# Patient Record
Sex: Female | Born: 1940
Health system: Southern US, Community
[De-identification: ages and names within clinical notes are randomized; demographics above are authoritative.]

## PROBLEM LIST (undated history)

## (undated) DIAGNOSIS — J449 Chronic obstructive pulmonary disease, unspecified: Secondary | ICD-10-CM

## (undated) DIAGNOSIS — F419 Anxiety disorder, unspecified: Secondary | ICD-10-CM

## (undated) DIAGNOSIS — M545 Low back pain, unspecified: Secondary | ICD-10-CM

## (undated) DIAGNOSIS — I509 Heart failure, unspecified: Secondary | ICD-10-CM

## (undated) DIAGNOSIS — R32 Unspecified urinary incontinence: Secondary | ICD-10-CM

## (undated) DIAGNOSIS — S73004A Unspecified dislocation of right hip, initial encounter: Secondary | ICD-10-CM

## (undated) DIAGNOSIS — E079 Disorder of thyroid, unspecified: Secondary | ICD-10-CM

## (undated) DIAGNOSIS — G8929 Other chronic pain: Secondary | ICD-10-CM

## (undated) DIAGNOSIS — Z8719 Personal history of other diseases of the digestive system: Secondary | ICD-10-CM

## (undated) DIAGNOSIS — Z9289 Personal history of other medical treatment: Secondary | ICD-10-CM

## (undated) DIAGNOSIS — E785 Hyperlipidemia, unspecified: Secondary | ICD-10-CM

## (undated) DIAGNOSIS — E119 Type 2 diabetes mellitus without complications: Secondary | ICD-10-CM

## (undated) DIAGNOSIS — E039 Hypothyroidism, unspecified: Secondary | ICD-10-CM

## (undated) DIAGNOSIS — I10 Essential (primary) hypertension: Secondary | ICD-10-CM

## (undated) HISTORY — PX: ABDOMINAL HYSTERECTOMY: SHX81

## (undated) HISTORY — PX: BACK SURGERY: SHX140

## (undated) HISTORY — PX: CHOLECYSTECTOMY: SHX55

## (undated) HISTORY — PX: DILATION AND CURETTAGE OF UTERUS: SHX78

## (undated) HISTORY — PX: ANKLE FRACTURE SURGERY: SHX122

---

## 1998-09-30 ENCOUNTER — Ambulatory Visit (HOSPITAL_COMMUNITY): Admission: RE | Admit: 1998-09-30 | Discharge: 1998-09-30 | Payer: Self-pay | Admitting: Family Medicine

## 1999-11-19 ENCOUNTER — Encounter: Admission: RE | Admit: 1999-11-19 | Discharge: 1999-11-19 | Payer: Self-pay | Admitting: Family Medicine

## 1999-11-19 ENCOUNTER — Encounter: Payer: Self-pay | Admitting: Family Medicine

## 1999-12-04 ENCOUNTER — Other Ambulatory Visit: Admission: RE | Admit: 1999-12-04 | Discharge: 1999-12-04 | Payer: Self-pay | Admitting: Family Medicine

## 2000-01-11 ENCOUNTER — Encounter: Payer: Self-pay | Admitting: Family Medicine

## 2000-01-11 ENCOUNTER — Encounter: Admission: RE | Admit: 2000-01-11 | Discharge: 2000-01-11 | Payer: Self-pay | Admitting: Family Medicine

## 2000-05-17 ENCOUNTER — Encounter: Admission: RE | Admit: 2000-05-17 | Discharge: 2000-05-17 | Payer: Self-pay | Admitting: Family Medicine

## 2000-05-17 ENCOUNTER — Encounter: Payer: Self-pay | Admitting: Family Medicine

## 2001-05-19 ENCOUNTER — Encounter: Payer: Self-pay | Admitting: Internal Medicine

## 2001-05-19 ENCOUNTER — Encounter: Admission: RE | Admit: 2001-05-19 | Discharge: 2001-05-19 | Payer: Self-pay | Admitting: Internal Medicine

## 2002-05-22 ENCOUNTER — Encounter: Payer: Self-pay | Admitting: Internal Medicine

## 2002-05-22 ENCOUNTER — Encounter: Admission: RE | Admit: 2002-05-22 | Discharge: 2002-05-22 | Payer: Self-pay | Admitting: Internal Medicine

## 2003-07-15 ENCOUNTER — Encounter: Payer: Self-pay | Admitting: Internal Medicine

## 2003-07-15 ENCOUNTER — Encounter: Admission: RE | Admit: 2003-07-15 | Discharge: 2003-07-15 | Payer: Self-pay | Admitting: Internal Medicine

## 2004-08-05 ENCOUNTER — Encounter: Admission: RE | Admit: 2004-08-05 | Discharge: 2004-08-05 | Payer: Self-pay | Admitting: Internal Medicine

## 2005-08-26 ENCOUNTER — Encounter: Admission: RE | Admit: 2005-08-26 | Discharge: 2005-08-26 | Payer: Self-pay | Admitting: Internal Medicine

## 2006-08-29 ENCOUNTER — Encounter: Admission: RE | Admit: 2006-08-29 | Discharge: 2006-08-29 | Payer: Self-pay | Admitting: Internal Medicine

## 2007-10-31 ENCOUNTER — Encounter: Admission: RE | Admit: 2007-10-31 | Discharge: 2007-10-31 | Payer: Self-pay | Admitting: Internal Medicine

## 2008-10-31 ENCOUNTER — Encounter: Admission: RE | Admit: 2008-10-31 | Discharge: 2008-10-31 | Payer: Self-pay | Admitting: Internal Medicine

## 2011-11-20 DIAGNOSIS — Z883 Allergy status to other anti-infective agents status: Secondary | ICD-10-CM | POA: Diagnosis not present

## 2011-11-20 DIAGNOSIS — K219 Gastro-esophageal reflux disease without esophagitis: Secondary | ICD-10-CM | POA: Diagnosis present

## 2011-11-20 DIAGNOSIS — E119 Type 2 diabetes mellitus without complications: Secondary | ICD-10-CM | POA: Diagnosis not present

## 2011-11-20 DIAGNOSIS — Z79899 Other long term (current) drug therapy: Secondary | ICD-10-CM | POA: Diagnosis not present

## 2011-11-20 DIAGNOSIS — E871 Hypo-osmolality and hyponatremia: Secondary | ICD-10-CM | POA: Diagnosis not present

## 2011-11-20 DIAGNOSIS — E039 Hypothyroidism, unspecified: Secondary | ICD-10-CM | POA: Diagnosis not present

## 2011-11-20 DIAGNOSIS — M129 Arthropathy, unspecified: Secondary | ICD-10-CM | POA: Diagnosis present

## 2011-11-20 DIAGNOSIS — Z886 Allergy status to analgesic agent status: Secondary | ICD-10-CM | POA: Diagnosis not present

## 2011-11-20 DIAGNOSIS — I4891 Unspecified atrial fibrillation: Secondary | ICD-10-CM | POA: Diagnosis not present

## 2011-11-20 DIAGNOSIS — G8929 Other chronic pain: Secondary | ICD-10-CM | POA: Diagnosis present

## 2011-11-20 DIAGNOSIS — Z888 Allergy status to other drugs, medicaments and biological substances status: Secondary | ICD-10-CM | POA: Diagnosis not present

## 2011-11-20 DIAGNOSIS — R079 Chest pain, unspecified: Secondary | ICD-10-CM | POA: Diagnosis not present

## 2011-11-20 DIAGNOSIS — R002 Palpitations: Secondary | ICD-10-CM | POA: Diagnosis not present

## 2011-11-20 DIAGNOSIS — R0789 Other chest pain: Secondary | ICD-10-CM | POA: Diagnosis not present

## 2011-11-20 DIAGNOSIS — R918 Other nonspecific abnormal finding of lung field: Secondary | ICD-10-CM | POA: Diagnosis not present

## 2011-11-20 DIAGNOSIS — Z7982 Long term (current) use of aspirin: Secondary | ICD-10-CM | POA: Diagnosis not present

## 2011-11-20 DIAGNOSIS — K449 Diaphragmatic hernia without obstruction or gangrene: Secondary | ICD-10-CM | POA: Diagnosis present

## 2011-11-20 DIAGNOSIS — I776 Arteritis, unspecified: Secondary | ICD-10-CM | POA: Diagnosis not present

## 2011-11-20 DIAGNOSIS — J189 Pneumonia, unspecified organism: Secondary | ICD-10-CM | POA: Diagnosis not present

## 2011-11-20 DIAGNOSIS — M545 Low back pain: Secondary | ICD-10-CM | POA: Diagnosis present

## 2011-11-20 DIAGNOSIS — R0602 Shortness of breath: Secondary | ICD-10-CM | POA: Diagnosis not present

## 2011-11-20 DIAGNOSIS — F17200 Nicotine dependence, unspecified, uncomplicated: Secondary | ICD-10-CM | POA: Diagnosis present

## 2011-11-20 DIAGNOSIS — E785 Hyperlipidemia, unspecified: Secondary | ICD-10-CM | POA: Diagnosis present

## 2011-11-20 DIAGNOSIS — E7800 Pure hypercholesterolemia, unspecified: Secondary | ICD-10-CM | POA: Diagnosis present

## 2011-11-22 DIAGNOSIS — I5030 Unspecified diastolic (congestive) heart failure: Secondary | ICD-10-CM

## 2011-11-24 DIAGNOSIS — R0602 Shortness of breath: Secondary | ICD-10-CM | POA: Diagnosis not present

## 2011-11-24 DIAGNOSIS — R918 Other nonspecific abnormal finding of lung field: Secondary | ICD-10-CM | POA: Diagnosis not present

## 2011-11-24 DIAGNOSIS — R079 Chest pain, unspecified: Secondary | ICD-10-CM | POA: Diagnosis not present

## 2011-11-24 DIAGNOSIS — J189 Pneumonia, unspecified organism: Secondary | ICD-10-CM | POA: Diagnosis not present

## 2011-11-25 DIAGNOSIS — E1165 Type 2 diabetes mellitus with hyperglycemia: Secondary | ICD-10-CM | POA: Diagnosis not present

## 2011-11-25 DIAGNOSIS — E039 Hypothyroidism, unspecified: Secondary | ICD-10-CM | POA: Diagnosis present

## 2011-11-25 DIAGNOSIS — E861 Hypovolemia: Secondary | ICD-10-CM | POA: Diagnosis present

## 2011-11-25 DIAGNOSIS — Z886 Allergy status to analgesic agent status: Secondary | ICD-10-CM | POA: Diagnosis not present

## 2011-11-25 DIAGNOSIS — J8410 Pulmonary fibrosis, unspecified: Secondary | ICD-10-CM | POA: Diagnosis not present

## 2011-11-25 DIAGNOSIS — E785 Hyperlipidemia, unspecified: Secondary | ICD-10-CM | POA: Diagnosis present

## 2011-11-25 DIAGNOSIS — E7800 Pure hypercholesterolemia, unspecified: Secondary | ICD-10-CM | POA: Diagnosis present

## 2011-11-25 DIAGNOSIS — Z780 Asymptomatic menopausal state: Secondary | ICD-10-CM | POA: Diagnosis not present

## 2011-11-25 DIAGNOSIS — J189 Pneumonia, unspecified organism: Secondary | ICD-10-CM | POA: Diagnosis not present

## 2011-11-25 DIAGNOSIS — K219 Gastro-esophageal reflux disease without esophagitis: Secondary | ICD-10-CM | POA: Diagnosis present

## 2011-11-25 DIAGNOSIS — Z883 Allergy status to other anti-infective agents status: Secondary | ICD-10-CM | POA: Diagnosis not present

## 2011-11-25 DIAGNOSIS — J449 Chronic obstructive pulmonary disease, unspecified: Secondary | ICD-10-CM | POA: Diagnosis present

## 2011-11-25 DIAGNOSIS — D509 Iron deficiency anemia, unspecified: Secondary | ICD-10-CM | POA: Diagnosis not present

## 2011-11-25 DIAGNOSIS — I517 Cardiomegaly: Secondary | ICD-10-CM | POA: Diagnosis not present

## 2011-11-25 DIAGNOSIS — M479 Spondylosis, unspecified: Secondary | ICD-10-CM | POA: Diagnosis present

## 2011-11-25 DIAGNOSIS — I776 Arteritis, unspecified: Secondary | ICD-10-CM | POA: Diagnosis not present

## 2011-11-25 DIAGNOSIS — R9389 Abnormal findings on diagnostic imaging of other specified body structures: Secondary | ICD-10-CM | POA: Diagnosis not present

## 2011-11-25 DIAGNOSIS — K449 Diaphragmatic hernia without obstruction or gangrene: Secondary | ICD-10-CM | POA: Diagnosis present

## 2011-11-25 DIAGNOSIS — I10 Essential (primary) hypertension: Secondary | ICD-10-CM | POA: Diagnosis not present

## 2011-11-25 DIAGNOSIS — Z79899 Other long term (current) drug therapy: Secondary | ICD-10-CM | POA: Diagnosis not present

## 2011-11-25 DIAGNOSIS — N189 Chronic kidney disease, unspecified: Secondary | ICD-10-CM | POA: Diagnosis not present

## 2011-11-25 DIAGNOSIS — F17200 Nicotine dependence, unspecified, uncomplicated: Secondary | ICD-10-CM | POA: Diagnosis present

## 2011-11-25 DIAGNOSIS — R918 Other nonspecific abnormal finding of lung field: Secondary | ICD-10-CM | POA: Diagnosis not present

## 2011-11-25 DIAGNOSIS — E119 Type 2 diabetes mellitus without complications: Secondary | ICD-10-CM | POA: Diagnosis present

## 2011-11-25 DIAGNOSIS — I4891 Unspecified atrial fibrillation: Secondary | ICD-10-CM | POA: Diagnosis not present

## 2011-11-25 DIAGNOSIS — G8929 Other chronic pain: Secondary | ICD-10-CM | POA: Diagnosis present

## 2011-11-25 DIAGNOSIS — E871 Hypo-osmolality and hyponatremia: Secondary | ICD-10-CM | POA: Diagnosis not present

## 2011-11-25 DIAGNOSIS — M545 Low back pain: Secondary | ICD-10-CM | POA: Diagnosis present

## 2011-12-21 DIAGNOSIS — J209 Acute bronchitis, unspecified: Secondary | ICD-10-CM | POA: Diagnosis not present

## 2011-12-21 DIAGNOSIS — K219 Gastro-esophageal reflux disease without esophagitis: Secondary | ICD-10-CM | POA: Diagnosis not present

## 2011-12-21 DIAGNOSIS — M545 Low back pain: Secondary | ICD-10-CM | POA: Diagnosis not present

## 2011-12-21 DIAGNOSIS — R1084 Generalized abdominal pain: Secondary | ICD-10-CM | POA: Diagnosis not present

## 2011-12-21 DIAGNOSIS — F329 Major depressive disorder, single episode, unspecified: Secondary | ICD-10-CM | POA: Diagnosis not present

## 2011-12-21 DIAGNOSIS — E782 Mixed hyperlipidemia: Secondary | ICD-10-CM | POA: Diagnosis not present

## 2012-01-24 DIAGNOSIS — R1084 Generalized abdominal pain: Secondary | ICD-10-CM | POA: Diagnosis not present

## 2012-01-24 DIAGNOSIS — J209 Acute bronchitis, unspecified: Secondary | ICD-10-CM | POA: Diagnosis not present

## 2012-01-24 DIAGNOSIS — M545 Low back pain: Secondary | ICD-10-CM | POA: Diagnosis not present

## 2012-01-24 DIAGNOSIS — K219 Gastro-esophageal reflux disease without esophagitis: Secondary | ICD-10-CM | POA: Diagnosis not present

## 2012-01-24 DIAGNOSIS — F329 Major depressive disorder, single episode, unspecified: Secondary | ICD-10-CM | POA: Diagnosis not present

## 2012-01-24 DIAGNOSIS — E782 Mixed hyperlipidemia: Secondary | ICD-10-CM | POA: Diagnosis not present

## 2012-02-14 DIAGNOSIS — M545 Low back pain: Secondary | ICD-10-CM | POA: Diagnosis not present

## 2012-02-14 DIAGNOSIS — J209 Acute bronchitis, unspecified: Secondary | ICD-10-CM | POA: Diagnosis not present

## 2012-02-14 DIAGNOSIS — F329 Major depressive disorder, single episode, unspecified: Secondary | ICD-10-CM | POA: Diagnosis not present

## 2012-02-14 DIAGNOSIS — E782 Mixed hyperlipidemia: Secondary | ICD-10-CM | POA: Diagnosis not present

## 2012-02-14 DIAGNOSIS — R1084 Generalized abdominal pain: Secondary | ICD-10-CM | POA: Diagnosis not present

## 2012-02-14 DIAGNOSIS — K219 Gastro-esophageal reflux disease without esophagitis: Secondary | ICD-10-CM | POA: Diagnosis not present

## 2012-02-23 DIAGNOSIS — R5381 Other malaise: Secondary | ICD-10-CM | POA: Diagnosis not present

## 2012-02-23 DIAGNOSIS — E871 Hypo-osmolality and hyponatremia: Secondary | ICD-10-CM | POA: Diagnosis not present

## 2012-02-23 DIAGNOSIS — K5900 Constipation, unspecified: Secondary | ICD-10-CM | POA: Diagnosis not present

## 2012-02-23 DIAGNOSIS — H6120 Impacted cerumen, unspecified ear: Secondary | ICD-10-CM | POA: Diagnosis not present

## 2012-02-23 DIAGNOSIS — F411 Generalized anxiety disorder: Secondary | ICD-10-CM | POA: Diagnosis not present

## 2012-02-23 DIAGNOSIS — R109 Unspecified abdominal pain: Secondary | ICD-10-CM | POA: Diagnosis not present

## 2012-02-23 DIAGNOSIS — I509 Heart failure, unspecified: Secondary | ICD-10-CM | POA: Diagnosis not present

## 2012-02-23 DIAGNOSIS — I10 Essential (primary) hypertension: Secondary | ICD-10-CM | POA: Diagnosis not present

## 2012-02-23 DIAGNOSIS — D649 Anemia, unspecified: Secondary | ICD-10-CM | POA: Diagnosis not present

## 2012-02-24 DIAGNOSIS — I10 Essential (primary) hypertension: Secondary | ICD-10-CM | POA: Diagnosis not present

## 2012-02-24 DIAGNOSIS — E871 Hypo-osmolality and hyponatremia: Secondary | ICD-10-CM | POA: Diagnosis not present

## 2012-02-24 DIAGNOSIS — F411 Generalized anxiety disorder: Secondary | ICD-10-CM | POA: Diagnosis not present

## 2012-02-24 DIAGNOSIS — R5381 Other malaise: Secondary | ICD-10-CM | POA: Diagnosis not present

## 2012-02-24 DIAGNOSIS — R109 Unspecified abdominal pain: Secondary | ICD-10-CM | POA: Diagnosis not present

## 2012-02-24 DIAGNOSIS — K5900 Constipation, unspecified: Secondary | ICD-10-CM | POA: Diagnosis not present

## 2012-02-24 DIAGNOSIS — D649 Anemia, unspecified: Secondary | ICD-10-CM | POA: Diagnosis not present

## 2012-02-24 DIAGNOSIS — I509 Heart failure, unspecified: Secondary | ICD-10-CM | POA: Diagnosis not present

## 2012-02-25 DIAGNOSIS — R3915 Urgency of urination: Secondary | ICD-10-CM | POA: Diagnosis present

## 2012-02-25 DIAGNOSIS — D649 Anemia, unspecified: Secondary | ICD-10-CM | POA: Diagnosis not present

## 2012-02-25 DIAGNOSIS — I509 Heart failure, unspecified: Secondary | ICD-10-CM | POA: Diagnosis not present

## 2012-02-25 DIAGNOSIS — J449 Chronic obstructive pulmonary disease, unspecified: Secondary | ICD-10-CM | POA: Diagnosis present

## 2012-02-25 DIAGNOSIS — R195 Other fecal abnormalities: Secondary | ICD-10-CM | POA: Diagnosis present

## 2012-02-25 DIAGNOSIS — E876 Hypokalemia: Secondary | ICD-10-CM | POA: Diagnosis present

## 2012-02-25 DIAGNOSIS — K5900 Constipation, unspecified: Secondary | ICD-10-CM | POA: Diagnosis not present

## 2012-02-25 DIAGNOSIS — E119 Type 2 diabetes mellitus without complications: Secondary | ICD-10-CM | POA: Diagnosis not present

## 2012-02-25 DIAGNOSIS — Z7982 Long term (current) use of aspirin: Secondary | ICD-10-CM | POA: Diagnosis not present

## 2012-02-25 DIAGNOSIS — E039 Hypothyroidism, unspecified: Secondary | ICD-10-CM | POA: Diagnosis present

## 2012-02-25 DIAGNOSIS — N289 Disorder of kidney and ureter, unspecified: Secondary | ICD-10-CM | POA: Diagnosis present

## 2012-02-25 DIAGNOSIS — F17200 Nicotine dependence, unspecified, uncomplicated: Secondary | ICD-10-CM | POA: Diagnosis present

## 2012-02-25 DIAGNOSIS — Z8249 Family history of ischemic heart disease and other diseases of the circulatory system: Secondary | ICD-10-CM | POA: Diagnosis not present

## 2012-02-25 DIAGNOSIS — E871 Hypo-osmolality and hyponatremia: Secondary | ICD-10-CM | POA: Diagnosis present

## 2012-02-25 DIAGNOSIS — Z79899 Other long term (current) drug therapy: Secondary | ICD-10-CM | POA: Diagnosis not present

## 2012-02-25 DIAGNOSIS — E1129 Type 2 diabetes mellitus with other diabetic kidney complication: Secondary | ICD-10-CM | POA: Diagnosis not present

## 2012-02-25 DIAGNOSIS — I10 Essential (primary) hypertension: Secondary | ICD-10-CM | POA: Diagnosis not present

## 2012-02-25 DIAGNOSIS — L089 Local infection of the skin and subcutaneous tissue, unspecified: Secondary | ICD-10-CM | POA: Diagnosis not present

## 2012-02-25 DIAGNOSIS — F411 Generalized anxiety disorder: Secondary | ICD-10-CM | POA: Diagnosis not present

## 2012-02-25 DIAGNOSIS — Z780 Asymptomatic menopausal state: Secondary | ICD-10-CM | POA: Diagnosis not present

## 2012-02-25 DIAGNOSIS — I776 Arteritis, unspecified: Secondary | ICD-10-CM | POA: Diagnosis not present

## 2012-02-25 DIAGNOSIS — N179 Acute kidney failure, unspecified: Secondary | ICD-10-CM | POA: Diagnosis not present

## 2012-02-25 DIAGNOSIS — M545 Low back pain: Secondary | ICD-10-CM | POA: Diagnosis present

## 2012-02-25 DIAGNOSIS — Z823 Family history of stroke: Secondary | ICD-10-CM | POA: Diagnosis not present

## 2012-02-25 DIAGNOSIS — D638 Anemia in other chronic diseases classified elsewhere: Secondary | ICD-10-CM | POA: Diagnosis not present

## 2012-02-25 DIAGNOSIS — R5383 Other fatigue: Secondary | ICD-10-CM | POA: Diagnosis not present

## 2012-02-25 DIAGNOSIS — G8929 Other chronic pain: Secondary | ICD-10-CM | POA: Diagnosis present

## 2012-02-25 DIAGNOSIS — I4891 Unspecified atrial fibrillation: Secondary | ICD-10-CM | POA: Diagnosis present

## 2012-02-25 DIAGNOSIS — Z885 Allergy status to narcotic agent status: Secondary | ICD-10-CM | POA: Diagnosis not present

## 2012-02-25 DIAGNOSIS — R350 Frequency of micturition: Secondary | ICD-10-CM | POA: Diagnosis present

## 2012-02-25 DIAGNOSIS — E785 Hyperlipidemia, unspecified: Secondary | ICD-10-CM | POA: Diagnosis present

## 2012-02-25 DIAGNOSIS — Z800 Family history of malignant neoplasm of digestive organs: Secondary | ICD-10-CM | POA: Diagnosis not present

## 2012-03-14 DIAGNOSIS — K529 Noninfective gastroenteritis and colitis, unspecified: Secondary | ICD-10-CM | POA: Diagnosis not present

## 2012-03-16 DIAGNOSIS — L989 Disorder of the skin and subcutaneous tissue, unspecified: Secondary | ICD-10-CM | POA: Diagnosis not present

## 2012-03-21 DIAGNOSIS — K2900 Acute gastritis without bleeding: Secondary | ICD-10-CM | POA: Diagnosis not present

## 2012-04-13 DIAGNOSIS — R195 Other fecal abnormalities: Secondary | ICD-10-CM | POA: Diagnosis not present

## 2012-04-13 DIAGNOSIS — D649 Anemia, unspecified: Secondary | ICD-10-CM | POA: Diagnosis not present

## 2012-04-25 DIAGNOSIS — R51 Headache: Secondary | ICD-10-CM | POA: Diagnosis not present

## 2012-04-25 DIAGNOSIS — R5383 Other fatigue: Secondary | ICD-10-CM | POA: Diagnosis not present

## 2012-04-25 DIAGNOSIS — D649 Anemia, unspecified: Secondary | ICD-10-CM | POA: Diagnosis not present

## 2012-05-02 DIAGNOSIS — K589 Irritable bowel syndrome without diarrhea: Secondary | ICD-10-CM | POA: Diagnosis not present

## 2012-05-02 DIAGNOSIS — E119 Type 2 diabetes mellitus without complications: Secondary | ICD-10-CM | POA: Diagnosis not present

## 2012-05-02 DIAGNOSIS — K219 Gastro-esophageal reflux disease without esophagitis: Secondary | ICD-10-CM | POA: Diagnosis not present

## 2012-05-02 DIAGNOSIS — Z7982 Long term (current) use of aspirin: Secondary | ICD-10-CM | POA: Diagnosis not present

## 2012-05-02 DIAGNOSIS — Z87891 Personal history of nicotine dependence: Secondary | ICD-10-CM | POA: Diagnosis not present

## 2012-05-02 DIAGNOSIS — R112 Nausea with vomiting, unspecified: Secondary | ICD-10-CM | POA: Diagnosis not present

## 2012-05-02 DIAGNOSIS — Z79899 Other long term (current) drug therapy: Secondary | ICD-10-CM | POA: Diagnosis not present

## 2012-05-02 DIAGNOSIS — I10 Essential (primary) hypertension: Secondary | ICD-10-CM | POA: Diagnosis not present

## 2012-05-15 DIAGNOSIS — IMO0002 Reserved for concepts with insufficient information to code with codable children: Secondary | ICD-10-CM | POA: Diagnosis not present

## 2012-05-17 DIAGNOSIS — F411 Generalized anxiety disorder: Secondary | ICD-10-CM | POA: Diagnosis not present

## 2012-05-17 DIAGNOSIS — E119 Type 2 diabetes mellitus without complications: Secondary | ICD-10-CM | POA: Diagnosis not present

## 2012-05-17 DIAGNOSIS — Z87891 Personal history of nicotine dependence: Secondary | ICD-10-CM | POA: Diagnosis not present

## 2012-05-17 DIAGNOSIS — I491 Atrial premature depolarization: Secondary | ICD-10-CM | POA: Diagnosis not present

## 2012-05-17 DIAGNOSIS — Z7982 Long term (current) use of aspirin: Secondary | ICD-10-CM | POA: Diagnosis not present

## 2012-05-17 DIAGNOSIS — R51 Headache: Secondary | ICD-10-CM | POA: Diagnosis not present

## 2012-05-17 DIAGNOSIS — I10 Essential (primary) hypertension: Secondary | ICD-10-CM | POA: Diagnosis not present

## 2012-05-17 DIAGNOSIS — G4700 Insomnia, unspecified: Secondary | ICD-10-CM | POA: Diagnosis not present

## 2012-05-17 DIAGNOSIS — Z79899 Other long term (current) drug therapy: Secondary | ICD-10-CM | POA: Diagnosis not present

## 2012-05-17 DIAGNOSIS — R42 Dizziness and giddiness: Secondary | ICD-10-CM | POA: Diagnosis not present

## 2012-06-30 DIAGNOSIS — M549 Dorsalgia, unspecified: Secondary | ICD-10-CM | POA: Diagnosis not present

## 2012-07-09 DIAGNOSIS — K5730 Diverticulosis of large intestine without perforation or abscess without bleeding: Secondary | ICD-10-CM | POA: Diagnosis not present

## 2012-07-09 DIAGNOSIS — R300 Dysuria: Secondary | ICD-10-CM | POA: Diagnosis not present

## 2012-07-09 DIAGNOSIS — M25559 Pain in unspecified hip: Secondary | ICD-10-CM | POA: Diagnosis not present

## 2012-07-09 DIAGNOSIS — R112 Nausea with vomiting, unspecified: Secondary | ICD-10-CM | POA: Diagnosis not present

## 2012-07-09 DIAGNOSIS — Z79899 Other long term (current) drug therapy: Secondary | ICD-10-CM | POA: Diagnosis not present

## 2012-07-09 DIAGNOSIS — R030 Elevated blood-pressure reading, without diagnosis of hypertension: Secondary | ICD-10-CM

## 2012-07-09 DIAGNOSIS — Z87891 Personal history of nicotine dependence: Secondary | ICD-10-CM | POA: Diagnosis not present

## 2012-07-09 DIAGNOSIS — I10 Essential (primary) hypertension: Secondary | ICD-10-CM | POA: Diagnosis not present

## 2012-07-09 DIAGNOSIS — E119 Type 2 diabetes mellitus without complications: Secondary | ICD-10-CM | POA: Diagnosis not present

## 2012-07-09 DIAGNOSIS — R197 Diarrhea, unspecified: Secondary | ICD-10-CM | POA: Diagnosis not present

## 2012-07-09 DIAGNOSIS — Z7982 Long term (current) use of aspirin: Secondary | ICD-10-CM | POA: Diagnosis not present

## 2012-07-13 DIAGNOSIS — Z981 Arthrodesis status: Secondary | ICD-10-CM | POA: Diagnosis not present

## 2012-07-13 DIAGNOSIS — M87059 Idiopathic aseptic necrosis of unspecified femur: Secondary | ICD-10-CM | POA: Diagnosis not present

## 2012-07-13 DIAGNOSIS — M169 Osteoarthritis of hip, unspecified: Secondary | ICD-10-CM | POA: Diagnosis not present

## 2012-07-26 DIAGNOSIS — E860 Dehydration: Secondary | ICD-10-CM | POA: Diagnosis not present

## 2012-07-26 DIAGNOSIS — K922 Gastrointestinal hemorrhage, unspecified: Secondary | ICD-10-CM | POA: Diagnosis not present

## 2012-07-26 DIAGNOSIS — D62 Acute posthemorrhagic anemia: Secondary | ICD-10-CM | POA: Diagnosis not present

## 2012-07-26 DIAGNOSIS — K5289 Other specified noninfective gastroenteritis and colitis: Secondary | ICD-10-CM | POA: Diagnosis not present

## 2012-07-26 DIAGNOSIS — R52 Pain, unspecified: Secondary | ICD-10-CM | POA: Diagnosis not present

## 2012-07-26 DIAGNOSIS — R112 Nausea with vomiting, unspecified: Secondary | ICD-10-CM | POA: Diagnosis not present

## 2012-07-27 DIAGNOSIS — K922 Gastrointestinal hemorrhage, unspecified: Secondary | ICD-10-CM | POA: Diagnosis not present

## 2012-07-27 DIAGNOSIS — F17200 Nicotine dependence, unspecified, uncomplicated: Secondary | ICD-10-CM | POA: Diagnosis present

## 2012-07-27 DIAGNOSIS — I10 Essential (primary) hypertension: Secondary | ICD-10-CM | POA: Diagnosis present

## 2012-07-27 DIAGNOSIS — R112 Nausea with vomiting, unspecified: Secondary | ICD-10-CM | POA: Diagnosis not present

## 2012-07-27 DIAGNOSIS — Z780 Asymptomatic menopausal state: Secondary | ICD-10-CM | POA: Diagnosis not present

## 2012-07-27 DIAGNOSIS — Z883 Allergy status to other anti-infective agents status: Secondary | ICD-10-CM | POA: Diagnosis not present

## 2012-07-27 DIAGNOSIS — R32 Unspecified urinary incontinence: Secondary | ICD-10-CM | POA: Diagnosis not present

## 2012-07-27 DIAGNOSIS — Z79899 Other long term (current) drug therapy: Secondary | ICD-10-CM | POA: Diagnosis not present

## 2012-07-27 DIAGNOSIS — Z888 Allergy status to other drugs, medicaments and biological substances status: Secondary | ICD-10-CM | POA: Diagnosis not present

## 2012-07-27 DIAGNOSIS — K5289 Other specified noninfective gastroenteritis and colitis: Secondary | ICD-10-CM | POA: Diagnosis present

## 2012-07-27 DIAGNOSIS — Z886 Allergy status to analgesic agent status: Secondary | ICD-10-CM | POA: Diagnosis not present

## 2012-07-27 DIAGNOSIS — D62 Acute posthemorrhagic anemia: Secondary | ICD-10-CM | POA: Diagnosis present

## 2012-07-27 DIAGNOSIS — E785 Hyperlipidemia, unspecified: Secondary | ICD-10-CM | POA: Diagnosis present

## 2012-07-27 DIAGNOSIS — N289 Disorder of kidney and ureter, unspecified: Secondary | ICD-10-CM | POA: Diagnosis present

## 2012-07-27 DIAGNOSIS — Z7982 Long term (current) use of aspirin: Secondary | ICD-10-CM | POA: Diagnosis not present

## 2012-07-27 DIAGNOSIS — E119 Type 2 diabetes mellitus without complications: Secondary | ICD-10-CM | POA: Diagnosis present

## 2012-07-27 DIAGNOSIS — E039 Hypothyroidism, unspecified: Secondary | ICD-10-CM | POA: Diagnosis present

## 2012-07-27 DIAGNOSIS — E860 Dehydration: Secondary | ICD-10-CM | POA: Diagnosis present

## 2012-07-27 DIAGNOSIS — K449 Diaphragmatic hernia without obstruction or gangrene: Secondary | ICD-10-CM | POA: Diagnosis present

## 2012-08-02 DIAGNOSIS — E119 Type 2 diabetes mellitus without complications: Secondary | ICD-10-CM | POA: Diagnosis not present

## 2012-08-02 DIAGNOSIS — K922 Gastrointestinal hemorrhage, unspecified: Secondary | ICD-10-CM | POA: Diagnosis not present

## 2012-08-02 DIAGNOSIS — K5289 Other specified noninfective gastroenteritis and colitis: Secondary | ICD-10-CM | POA: Diagnosis not present

## 2012-08-02 DIAGNOSIS — M1610 Unilateral primary osteoarthritis, unspecified hip: Secondary | ICD-10-CM | POA: Diagnosis not present

## 2012-08-02 DIAGNOSIS — J449 Chronic obstructive pulmonary disease, unspecified: Secondary | ICD-10-CM | POA: Diagnosis not present

## 2012-08-02 DIAGNOSIS — I10 Essential (primary) hypertension: Secondary | ICD-10-CM | POA: Diagnosis not present

## 2012-08-03 DIAGNOSIS — J449 Chronic obstructive pulmonary disease, unspecified: Secondary | ICD-10-CM | POA: Diagnosis not present

## 2012-08-03 DIAGNOSIS — I10 Essential (primary) hypertension: Secondary | ICD-10-CM | POA: Diagnosis not present

## 2012-08-03 DIAGNOSIS — M1610 Unilateral primary osteoarthritis, unspecified hip: Secondary | ICD-10-CM | POA: Diagnosis not present

## 2012-08-03 DIAGNOSIS — E119 Type 2 diabetes mellitus without complications: Secondary | ICD-10-CM | POA: Diagnosis not present

## 2012-08-03 DIAGNOSIS — K922 Gastrointestinal hemorrhage, unspecified: Secondary | ICD-10-CM | POA: Diagnosis not present

## 2012-08-03 DIAGNOSIS — K5289 Other specified noninfective gastroenteritis and colitis: Secondary | ICD-10-CM | POA: Diagnosis not present

## 2012-08-04 DIAGNOSIS — K922 Gastrointestinal hemorrhage, unspecified: Secondary | ICD-10-CM | POA: Diagnosis not present

## 2012-08-04 DIAGNOSIS — E119 Type 2 diabetes mellitus without complications: Secondary | ICD-10-CM | POA: Diagnosis not present

## 2012-08-04 DIAGNOSIS — I10 Essential (primary) hypertension: Secondary | ICD-10-CM | POA: Diagnosis not present

## 2012-08-04 DIAGNOSIS — M1610 Unilateral primary osteoarthritis, unspecified hip: Secondary | ICD-10-CM | POA: Diagnosis not present

## 2012-08-04 DIAGNOSIS — J449 Chronic obstructive pulmonary disease, unspecified: Secondary | ICD-10-CM | POA: Diagnosis not present

## 2012-08-04 DIAGNOSIS — K5289 Other specified noninfective gastroenteritis and colitis: Secondary | ICD-10-CM | POA: Diagnosis not present

## 2012-08-05 DIAGNOSIS — K922 Gastrointestinal hemorrhage, unspecified: Secondary | ICD-10-CM | POA: Diagnosis not present

## 2012-08-05 DIAGNOSIS — I10 Essential (primary) hypertension: Secondary | ICD-10-CM | POA: Diagnosis not present

## 2012-08-05 DIAGNOSIS — K5289 Other specified noninfective gastroenteritis and colitis: Secondary | ICD-10-CM | POA: Diagnosis not present

## 2012-08-05 DIAGNOSIS — E119 Type 2 diabetes mellitus without complications: Secondary | ICD-10-CM | POA: Diagnosis not present

## 2012-08-05 DIAGNOSIS — M1610 Unilateral primary osteoarthritis, unspecified hip: Secondary | ICD-10-CM | POA: Diagnosis not present

## 2012-08-05 DIAGNOSIS — J449 Chronic obstructive pulmonary disease, unspecified: Secondary | ICD-10-CM | POA: Diagnosis not present

## 2012-08-07 DIAGNOSIS — K5289 Other specified noninfective gastroenteritis and colitis: Secondary | ICD-10-CM | POA: Diagnosis not present

## 2012-08-07 DIAGNOSIS — D649 Anemia, unspecified: Secondary | ICD-10-CM | POA: Diagnosis not present

## 2012-08-07 DIAGNOSIS — I10 Essential (primary) hypertension: Secondary | ICD-10-CM | POA: Diagnosis not present

## 2012-08-07 DIAGNOSIS — J449 Chronic obstructive pulmonary disease, unspecified: Secondary | ICD-10-CM | POA: Diagnosis not present

## 2012-08-07 DIAGNOSIS — K921 Melena: Secondary | ICD-10-CM | POA: Diagnosis not present

## 2012-08-07 DIAGNOSIS — K922 Gastrointestinal hemorrhage, unspecified: Secondary | ICD-10-CM | POA: Diagnosis not present

## 2012-08-07 DIAGNOSIS — E119 Type 2 diabetes mellitus without complications: Secondary | ICD-10-CM | POA: Diagnosis not present

## 2012-08-07 DIAGNOSIS — M1610 Unilateral primary osteoarthritis, unspecified hip: Secondary | ICD-10-CM | POA: Diagnosis not present

## 2012-08-08 DIAGNOSIS — I10 Essential (primary) hypertension: Secondary | ICD-10-CM | POA: Diagnosis not present

## 2012-08-08 DIAGNOSIS — E119 Type 2 diabetes mellitus without complications: Secondary | ICD-10-CM | POA: Diagnosis not present

## 2012-08-08 DIAGNOSIS — K5289 Other specified noninfective gastroenteritis and colitis: Secondary | ICD-10-CM | POA: Diagnosis not present

## 2012-08-08 DIAGNOSIS — K922 Gastrointestinal hemorrhage, unspecified: Secondary | ICD-10-CM | POA: Diagnosis not present

## 2012-08-08 DIAGNOSIS — M1610 Unilateral primary osteoarthritis, unspecified hip: Secondary | ICD-10-CM | POA: Diagnosis not present

## 2012-08-08 DIAGNOSIS — J449 Chronic obstructive pulmonary disease, unspecified: Secondary | ICD-10-CM | POA: Diagnosis not present

## 2012-08-09 DIAGNOSIS — E119 Type 2 diabetes mellitus without complications: Secondary | ICD-10-CM | POA: Diagnosis not present

## 2012-08-09 DIAGNOSIS — K922 Gastrointestinal hemorrhage, unspecified: Secondary | ICD-10-CM | POA: Diagnosis not present

## 2012-08-09 DIAGNOSIS — I10 Essential (primary) hypertension: Secondary | ICD-10-CM | POA: Diagnosis not present

## 2012-08-09 DIAGNOSIS — M1610 Unilateral primary osteoarthritis, unspecified hip: Secondary | ICD-10-CM | POA: Diagnosis not present

## 2012-08-09 DIAGNOSIS — J449 Chronic obstructive pulmonary disease, unspecified: Secondary | ICD-10-CM | POA: Diagnosis not present

## 2012-08-09 DIAGNOSIS — K5289 Other specified noninfective gastroenteritis and colitis: Secondary | ICD-10-CM | POA: Diagnosis not present

## 2012-08-11 DIAGNOSIS — J449 Chronic obstructive pulmonary disease, unspecified: Secondary | ICD-10-CM | POA: Diagnosis not present

## 2012-08-11 DIAGNOSIS — K922 Gastrointestinal hemorrhage, unspecified: Secondary | ICD-10-CM | POA: Diagnosis not present

## 2012-08-11 DIAGNOSIS — M1610 Unilateral primary osteoarthritis, unspecified hip: Secondary | ICD-10-CM | POA: Diagnosis not present

## 2012-08-11 DIAGNOSIS — E119 Type 2 diabetes mellitus without complications: Secondary | ICD-10-CM | POA: Diagnosis not present

## 2012-08-11 DIAGNOSIS — K5289 Other specified noninfective gastroenteritis and colitis: Secondary | ICD-10-CM | POA: Diagnosis not present

## 2012-08-11 DIAGNOSIS — I10 Essential (primary) hypertension: Secondary | ICD-10-CM | POA: Diagnosis not present

## 2012-08-14 DIAGNOSIS — E119 Type 2 diabetes mellitus without complications: Secondary | ICD-10-CM | POA: Diagnosis not present

## 2012-08-14 DIAGNOSIS — M1610 Unilateral primary osteoarthritis, unspecified hip: Secondary | ICD-10-CM | POA: Diagnosis not present

## 2012-08-14 DIAGNOSIS — K5289 Other specified noninfective gastroenteritis and colitis: Secondary | ICD-10-CM | POA: Diagnosis not present

## 2012-08-14 DIAGNOSIS — K922 Gastrointestinal hemorrhage, unspecified: Secondary | ICD-10-CM | POA: Diagnosis not present

## 2012-08-14 DIAGNOSIS — J449 Chronic obstructive pulmonary disease, unspecified: Secondary | ICD-10-CM | POA: Diagnosis not present

## 2012-08-14 DIAGNOSIS — I10 Essential (primary) hypertension: Secondary | ICD-10-CM | POA: Diagnosis not present

## 2012-08-15 DIAGNOSIS — J449 Chronic obstructive pulmonary disease, unspecified: Secondary | ICD-10-CM | POA: Diagnosis not present

## 2012-08-15 DIAGNOSIS — K5289 Other specified noninfective gastroenteritis and colitis: Secondary | ICD-10-CM | POA: Diagnosis not present

## 2012-08-15 DIAGNOSIS — I10 Essential (primary) hypertension: Secondary | ICD-10-CM | POA: Diagnosis not present

## 2012-08-15 DIAGNOSIS — E119 Type 2 diabetes mellitus without complications: Secondary | ICD-10-CM | POA: Diagnosis not present

## 2012-08-15 DIAGNOSIS — K922 Gastrointestinal hemorrhage, unspecified: Secondary | ICD-10-CM | POA: Diagnosis not present

## 2012-08-15 DIAGNOSIS — M1610 Unilateral primary osteoarthritis, unspecified hip: Secondary | ICD-10-CM | POA: Diagnosis not present

## 2012-08-16 DIAGNOSIS — E119 Type 2 diabetes mellitus without complications: Secondary | ICD-10-CM | POA: Diagnosis not present

## 2012-08-16 DIAGNOSIS — K5289 Other specified noninfective gastroenteritis and colitis: Secondary | ICD-10-CM | POA: Diagnosis not present

## 2012-08-16 DIAGNOSIS — J449 Chronic obstructive pulmonary disease, unspecified: Secondary | ICD-10-CM | POA: Diagnosis not present

## 2012-08-16 DIAGNOSIS — K922 Gastrointestinal hemorrhage, unspecified: Secondary | ICD-10-CM | POA: Diagnosis not present

## 2012-08-16 DIAGNOSIS — M1610 Unilateral primary osteoarthritis, unspecified hip: Secondary | ICD-10-CM | POA: Diagnosis not present

## 2012-08-16 DIAGNOSIS — I10 Essential (primary) hypertension: Secondary | ICD-10-CM | POA: Diagnosis not present

## 2012-08-17 DIAGNOSIS — K922 Gastrointestinal hemorrhage, unspecified: Secondary | ICD-10-CM | POA: Diagnosis not present

## 2012-08-17 DIAGNOSIS — R195 Other fecal abnormalities: Secondary | ICD-10-CM | POA: Diagnosis not present

## 2012-08-17 DIAGNOSIS — D649 Anemia, unspecified: Secondary | ICD-10-CM | POA: Diagnosis not present

## 2012-08-18 DIAGNOSIS — M1610 Unilateral primary osteoarthritis, unspecified hip: Secondary | ICD-10-CM | POA: Diagnosis not present

## 2012-08-18 DIAGNOSIS — K922 Gastrointestinal hemorrhage, unspecified: Secondary | ICD-10-CM | POA: Diagnosis not present

## 2012-08-18 DIAGNOSIS — J449 Chronic obstructive pulmonary disease, unspecified: Secondary | ICD-10-CM | POA: Diagnosis not present

## 2012-08-18 DIAGNOSIS — K5289 Other specified noninfective gastroenteritis and colitis: Secondary | ICD-10-CM | POA: Diagnosis not present

## 2012-08-18 DIAGNOSIS — E119 Type 2 diabetes mellitus without complications: Secondary | ICD-10-CM | POA: Diagnosis not present

## 2012-08-18 DIAGNOSIS — I10 Essential (primary) hypertension: Secondary | ICD-10-CM | POA: Diagnosis not present

## 2012-08-22 DIAGNOSIS — J449 Chronic obstructive pulmonary disease, unspecified: Secondary | ICD-10-CM | POA: Diagnosis not present

## 2012-08-22 DIAGNOSIS — K922 Gastrointestinal hemorrhage, unspecified: Secondary | ICD-10-CM | POA: Diagnosis not present

## 2012-08-22 DIAGNOSIS — K5289 Other specified noninfective gastroenteritis and colitis: Secondary | ICD-10-CM | POA: Diagnosis not present

## 2012-08-22 DIAGNOSIS — M1610 Unilateral primary osteoarthritis, unspecified hip: Secondary | ICD-10-CM | POA: Diagnosis not present

## 2012-08-22 DIAGNOSIS — E119 Type 2 diabetes mellitus without complications: Secondary | ICD-10-CM | POA: Diagnosis not present

## 2012-08-22 DIAGNOSIS — I10 Essential (primary) hypertension: Secondary | ICD-10-CM | POA: Diagnosis not present

## 2012-08-23 DIAGNOSIS — G589 Mononeuropathy, unspecified: Secondary | ICD-10-CM | POA: Diagnosis not present

## 2012-08-23 DIAGNOSIS — R195 Other fecal abnormalities: Secondary | ICD-10-CM | POA: Diagnosis not present

## 2012-08-23 DIAGNOSIS — K5730 Diverticulosis of large intestine without perforation or abscess without bleeding: Secondary | ICD-10-CM | POA: Diagnosis not present

## 2012-08-23 DIAGNOSIS — K2970 Gastritis, unspecified, without bleeding: Secondary | ICD-10-CM | POA: Diagnosis not present

## 2012-08-23 DIAGNOSIS — D126 Benign neoplasm of colon, unspecified: Secondary | ICD-10-CM | POA: Diagnosis not present

## 2012-08-23 DIAGNOSIS — E039 Hypothyroidism, unspecified: Secondary | ICD-10-CM | POA: Diagnosis not present

## 2012-08-23 DIAGNOSIS — K219 Gastro-esophageal reflux disease without esophagitis: Secondary | ICD-10-CM | POA: Diagnosis not present

## 2012-08-23 DIAGNOSIS — D649 Anemia, unspecified: Secondary | ICD-10-CM | POA: Diagnosis not present

## 2012-08-23 DIAGNOSIS — I10 Essential (primary) hypertension: Secondary | ICD-10-CM | POA: Diagnosis not present

## 2012-08-23 DIAGNOSIS — Z79899 Other long term (current) drug therapy: Secondary | ICD-10-CM | POA: Diagnosis not present

## 2012-08-23 DIAGNOSIS — Z87891 Personal history of nicotine dependence: Secondary | ICD-10-CM | POA: Diagnosis not present

## 2012-08-23 DIAGNOSIS — Z883 Allergy status to other anti-infective agents status: Secondary | ICD-10-CM | POA: Diagnosis not present

## 2012-08-23 DIAGNOSIS — K259 Gastric ulcer, unspecified as acute or chronic, without hemorrhage or perforation: Secondary | ICD-10-CM | POA: Diagnosis not present

## 2012-08-23 DIAGNOSIS — I509 Heart failure, unspecified: Secondary | ICD-10-CM | POA: Diagnosis not present

## 2012-08-23 DIAGNOSIS — E119 Type 2 diabetes mellitus without complications: Secondary | ICD-10-CM | POA: Diagnosis not present

## 2012-08-23 DIAGNOSIS — K56609 Unspecified intestinal obstruction, unspecified as to partial versus complete obstruction: Secondary | ICD-10-CM | POA: Diagnosis not present

## 2012-08-23 DIAGNOSIS — Z808 Family history of malignant neoplasm of other organs or systems: Secondary | ICD-10-CM | POA: Diagnosis not present

## 2012-08-23 DIAGNOSIS — K922 Gastrointestinal hemorrhage, unspecified: Secondary | ICD-10-CM | POA: Diagnosis not present

## 2012-08-23 DIAGNOSIS — K2990 Gastroduodenitis, unspecified, without bleeding: Secondary | ICD-10-CM | POA: Diagnosis not present

## 2012-08-24 DIAGNOSIS — K5289 Other specified noninfective gastroenteritis and colitis: Secondary | ICD-10-CM | POA: Diagnosis not present

## 2012-08-24 DIAGNOSIS — M1610 Unilateral primary osteoarthritis, unspecified hip: Secondary | ICD-10-CM | POA: Diagnosis not present

## 2012-08-24 DIAGNOSIS — J449 Chronic obstructive pulmonary disease, unspecified: Secondary | ICD-10-CM | POA: Diagnosis not present

## 2012-08-24 DIAGNOSIS — E119 Type 2 diabetes mellitus without complications: Secondary | ICD-10-CM | POA: Diagnosis not present

## 2012-08-24 DIAGNOSIS — K922 Gastrointestinal hemorrhage, unspecified: Secondary | ICD-10-CM | POA: Diagnosis not present

## 2012-08-24 DIAGNOSIS — I10 Essential (primary) hypertension: Secondary | ICD-10-CM | POA: Diagnosis not present

## 2012-08-25 DIAGNOSIS — I10 Essential (primary) hypertension: Secondary | ICD-10-CM | POA: Diagnosis not present

## 2012-08-25 DIAGNOSIS — K922 Gastrointestinal hemorrhage, unspecified: Secondary | ICD-10-CM | POA: Diagnosis not present

## 2012-08-25 DIAGNOSIS — M1610 Unilateral primary osteoarthritis, unspecified hip: Secondary | ICD-10-CM | POA: Diagnosis not present

## 2012-08-25 DIAGNOSIS — E119 Type 2 diabetes mellitus without complications: Secondary | ICD-10-CM | POA: Diagnosis not present

## 2012-08-25 DIAGNOSIS — K5289 Other specified noninfective gastroenteritis and colitis: Secondary | ICD-10-CM | POA: Diagnosis not present

## 2012-08-25 DIAGNOSIS — J449 Chronic obstructive pulmonary disease, unspecified: Secondary | ICD-10-CM | POA: Diagnosis not present

## 2012-08-29 DIAGNOSIS — I10 Essential (primary) hypertension: Secondary | ICD-10-CM | POA: Diagnosis not present

## 2012-08-29 DIAGNOSIS — K922 Gastrointestinal hemorrhage, unspecified: Secondary | ICD-10-CM | POA: Diagnosis not present

## 2012-08-29 DIAGNOSIS — K5289 Other specified noninfective gastroenteritis and colitis: Secondary | ICD-10-CM | POA: Diagnosis not present

## 2012-08-29 DIAGNOSIS — J449 Chronic obstructive pulmonary disease, unspecified: Secondary | ICD-10-CM | POA: Diagnosis not present

## 2012-08-29 DIAGNOSIS — E119 Type 2 diabetes mellitus without complications: Secondary | ICD-10-CM | POA: Diagnosis not present

## 2012-08-29 DIAGNOSIS — M1610 Unilateral primary osteoarthritis, unspecified hip: Secondary | ICD-10-CM | POA: Diagnosis not present

## 2012-08-30 DIAGNOSIS — K922 Gastrointestinal hemorrhage, unspecified: Secondary | ICD-10-CM | POA: Diagnosis not present

## 2012-08-30 DIAGNOSIS — I10 Essential (primary) hypertension: Secondary | ICD-10-CM | POA: Diagnosis not present

## 2012-08-30 DIAGNOSIS — M1610 Unilateral primary osteoarthritis, unspecified hip: Secondary | ICD-10-CM | POA: Diagnosis not present

## 2012-08-30 DIAGNOSIS — E119 Type 2 diabetes mellitus without complications: Secondary | ICD-10-CM | POA: Diagnosis not present

## 2012-08-30 DIAGNOSIS — K5289 Other specified noninfective gastroenteritis and colitis: Secondary | ICD-10-CM | POA: Diagnosis not present

## 2012-08-30 DIAGNOSIS — J449 Chronic obstructive pulmonary disease, unspecified: Secondary | ICD-10-CM | POA: Diagnosis not present

## 2012-09-01 DIAGNOSIS — I10 Essential (primary) hypertension: Secondary | ICD-10-CM | POA: Diagnosis not present

## 2012-09-01 DIAGNOSIS — K5289 Other specified noninfective gastroenteritis and colitis: Secondary | ICD-10-CM | POA: Diagnosis not present

## 2012-09-01 DIAGNOSIS — J449 Chronic obstructive pulmonary disease, unspecified: Secondary | ICD-10-CM | POA: Diagnosis not present

## 2012-09-01 DIAGNOSIS — M1610 Unilateral primary osteoarthritis, unspecified hip: Secondary | ICD-10-CM | POA: Diagnosis not present

## 2012-09-01 DIAGNOSIS — E119 Type 2 diabetes mellitus without complications: Secondary | ICD-10-CM | POA: Diagnosis not present

## 2012-09-01 DIAGNOSIS — K922 Gastrointestinal hemorrhage, unspecified: Secondary | ICD-10-CM | POA: Diagnosis not present

## 2012-09-06 DIAGNOSIS — M1610 Unilateral primary osteoarthritis, unspecified hip: Secondary | ICD-10-CM | POA: Diagnosis not present

## 2012-09-06 DIAGNOSIS — K922 Gastrointestinal hemorrhage, unspecified: Secondary | ICD-10-CM | POA: Diagnosis not present

## 2012-09-06 DIAGNOSIS — E119 Type 2 diabetes mellitus without complications: Secondary | ICD-10-CM | POA: Diagnosis not present

## 2012-09-06 DIAGNOSIS — K5289 Other specified noninfective gastroenteritis and colitis: Secondary | ICD-10-CM | POA: Diagnosis not present

## 2012-09-06 DIAGNOSIS — J449 Chronic obstructive pulmonary disease, unspecified: Secondary | ICD-10-CM | POA: Diagnosis not present

## 2012-09-06 DIAGNOSIS — I10 Essential (primary) hypertension: Secondary | ICD-10-CM | POA: Diagnosis not present

## 2012-09-07 DIAGNOSIS — M1610 Unilateral primary osteoarthritis, unspecified hip: Secondary | ICD-10-CM | POA: Diagnosis not present

## 2012-09-07 DIAGNOSIS — J449 Chronic obstructive pulmonary disease, unspecified: Secondary | ICD-10-CM | POA: Diagnosis not present

## 2012-09-07 DIAGNOSIS — K922 Gastrointestinal hemorrhage, unspecified: Secondary | ICD-10-CM | POA: Diagnosis not present

## 2012-09-07 DIAGNOSIS — K5289 Other specified noninfective gastroenteritis and colitis: Secondary | ICD-10-CM | POA: Diagnosis not present

## 2012-09-07 DIAGNOSIS — I10 Essential (primary) hypertension: Secondary | ICD-10-CM | POA: Diagnosis not present

## 2012-09-07 DIAGNOSIS — E119 Type 2 diabetes mellitus without complications: Secondary | ICD-10-CM | POA: Diagnosis not present

## 2012-09-11 DIAGNOSIS — J449 Chronic obstructive pulmonary disease, unspecified: Secondary | ICD-10-CM | POA: Diagnosis not present

## 2012-09-11 DIAGNOSIS — K922 Gastrointestinal hemorrhage, unspecified: Secondary | ICD-10-CM | POA: Diagnosis not present

## 2012-09-11 DIAGNOSIS — M1610 Unilateral primary osteoarthritis, unspecified hip: Secondary | ICD-10-CM | POA: Diagnosis not present

## 2012-09-11 DIAGNOSIS — K5289 Other specified noninfective gastroenteritis and colitis: Secondary | ICD-10-CM | POA: Diagnosis not present

## 2012-09-11 DIAGNOSIS — E119 Type 2 diabetes mellitus without complications: Secondary | ICD-10-CM | POA: Diagnosis not present

## 2012-09-11 DIAGNOSIS — I10 Essential (primary) hypertension: Secondary | ICD-10-CM | POA: Diagnosis not present

## 2012-09-13 DIAGNOSIS — J449 Chronic obstructive pulmonary disease, unspecified: Secondary | ICD-10-CM | POA: Diagnosis not present

## 2012-09-13 DIAGNOSIS — M1610 Unilateral primary osteoarthritis, unspecified hip: Secondary | ICD-10-CM | POA: Diagnosis not present

## 2012-09-13 DIAGNOSIS — K922 Gastrointestinal hemorrhage, unspecified: Secondary | ICD-10-CM | POA: Diagnosis not present

## 2012-09-13 DIAGNOSIS — K5289 Other specified noninfective gastroenteritis and colitis: Secondary | ICD-10-CM | POA: Diagnosis not present

## 2012-09-13 DIAGNOSIS — I10 Essential (primary) hypertension: Secondary | ICD-10-CM | POA: Diagnosis not present

## 2012-09-13 DIAGNOSIS — E119 Type 2 diabetes mellitus without complications: Secondary | ICD-10-CM | POA: Diagnosis not present

## 2012-09-14 DIAGNOSIS — J449 Chronic obstructive pulmonary disease, unspecified: Secondary | ICD-10-CM | POA: Diagnosis not present

## 2012-09-14 DIAGNOSIS — E119 Type 2 diabetes mellitus without complications: Secondary | ICD-10-CM | POA: Diagnosis not present

## 2012-09-14 DIAGNOSIS — I10 Essential (primary) hypertension: Secondary | ICD-10-CM | POA: Diagnosis not present

## 2012-09-14 DIAGNOSIS — K5289 Other specified noninfective gastroenteritis and colitis: Secondary | ICD-10-CM | POA: Diagnosis not present

## 2012-09-14 DIAGNOSIS — K922 Gastrointestinal hemorrhage, unspecified: Secondary | ICD-10-CM | POA: Diagnosis not present

## 2012-09-14 DIAGNOSIS — M1610 Unilateral primary osteoarthritis, unspecified hip: Secondary | ICD-10-CM | POA: Diagnosis not present

## 2012-09-19 DIAGNOSIS — K922 Gastrointestinal hemorrhage, unspecified: Secondary | ICD-10-CM | POA: Diagnosis not present

## 2012-09-19 DIAGNOSIS — E119 Type 2 diabetes mellitus without complications: Secondary | ICD-10-CM | POA: Diagnosis not present

## 2012-09-19 DIAGNOSIS — J449 Chronic obstructive pulmonary disease, unspecified: Secondary | ICD-10-CM | POA: Diagnosis not present

## 2012-09-19 DIAGNOSIS — I10 Essential (primary) hypertension: Secondary | ICD-10-CM | POA: Diagnosis not present

## 2012-09-19 DIAGNOSIS — K5289 Other specified noninfective gastroenteritis and colitis: Secondary | ICD-10-CM | POA: Diagnosis not present

## 2012-09-19 DIAGNOSIS — M1610 Unilateral primary osteoarthritis, unspecified hip: Secondary | ICD-10-CM | POA: Diagnosis not present

## 2012-09-20 DIAGNOSIS — K259 Gastric ulcer, unspecified as acute or chronic, without hemorrhage or perforation: Secondary | ICD-10-CM | POA: Diagnosis not present

## 2012-09-21 DIAGNOSIS — J449 Chronic obstructive pulmonary disease, unspecified: Secondary | ICD-10-CM | POA: Diagnosis not present

## 2012-09-21 DIAGNOSIS — K922 Gastrointestinal hemorrhage, unspecified: Secondary | ICD-10-CM | POA: Diagnosis not present

## 2012-09-21 DIAGNOSIS — I10 Essential (primary) hypertension: Secondary | ICD-10-CM | POA: Diagnosis not present

## 2012-09-21 DIAGNOSIS — K5289 Other specified noninfective gastroenteritis and colitis: Secondary | ICD-10-CM | POA: Diagnosis not present

## 2012-09-21 DIAGNOSIS — M1610 Unilateral primary osteoarthritis, unspecified hip: Secondary | ICD-10-CM | POA: Diagnosis not present

## 2012-09-21 DIAGNOSIS — E119 Type 2 diabetes mellitus without complications: Secondary | ICD-10-CM | POA: Diagnosis not present

## 2012-09-22 DIAGNOSIS — E119 Type 2 diabetes mellitus without complications: Secondary | ICD-10-CM | POA: Diagnosis not present

## 2012-09-22 DIAGNOSIS — K922 Gastrointestinal hemorrhage, unspecified: Secondary | ICD-10-CM | POA: Diagnosis not present

## 2012-09-22 DIAGNOSIS — I10 Essential (primary) hypertension: Secondary | ICD-10-CM | POA: Diagnosis not present

## 2012-09-22 DIAGNOSIS — M1610 Unilateral primary osteoarthritis, unspecified hip: Secondary | ICD-10-CM | POA: Diagnosis not present

## 2012-09-22 DIAGNOSIS — J449 Chronic obstructive pulmonary disease, unspecified: Secondary | ICD-10-CM | POA: Diagnosis not present

## 2012-09-22 DIAGNOSIS — K5289 Other specified noninfective gastroenteritis and colitis: Secondary | ICD-10-CM | POA: Diagnosis not present

## 2012-09-25 DIAGNOSIS — K5289 Other specified noninfective gastroenteritis and colitis: Secondary | ICD-10-CM | POA: Diagnosis not present

## 2012-09-25 DIAGNOSIS — J449 Chronic obstructive pulmonary disease, unspecified: Secondary | ICD-10-CM | POA: Diagnosis not present

## 2012-09-25 DIAGNOSIS — K922 Gastrointestinal hemorrhage, unspecified: Secondary | ICD-10-CM | POA: Diagnosis not present

## 2012-09-25 DIAGNOSIS — E119 Type 2 diabetes mellitus without complications: Secondary | ICD-10-CM | POA: Diagnosis not present

## 2012-09-25 DIAGNOSIS — I10 Essential (primary) hypertension: Secondary | ICD-10-CM | POA: Diagnosis not present

## 2012-09-25 DIAGNOSIS — M1610 Unilateral primary osteoarthritis, unspecified hip: Secondary | ICD-10-CM | POA: Diagnosis not present

## 2012-09-27 DIAGNOSIS — K5289 Other specified noninfective gastroenteritis and colitis: Secondary | ICD-10-CM | POA: Diagnosis not present

## 2012-09-27 DIAGNOSIS — I10 Essential (primary) hypertension: Secondary | ICD-10-CM | POA: Diagnosis not present

## 2012-09-27 DIAGNOSIS — E119 Type 2 diabetes mellitus without complications: Secondary | ICD-10-CM | POA: Diagnosis not present

## 2012-09-27 DIAGNOSIS — K922 Gastrointestinal hemorrhage, unspecified: Secondary | ICD-10-CM | POA: Diagnosis not present

## 2012-09-27 DIAGNOSIS — M1610 Unilateral primary osteoarthritis, unspecified hip: Secondary | ICD-10-CM | POA: Diagnosis not present

## 2012-09-27 DIAGNOSIS — J449 Chronic obstructive pulmonary disease, unspecified: Secondary | ICD-10-CM | POA: Diagnosis not present

## 2012-09-28 DIAGNOSIS — M1610 Unilateral primary osteoarthritis, unspecified hip: Secondary | ICD-10-CM | POA: Diagnosis not present

## 2012-09-28 DIAGNOSIS — E119 Type 2 diabetes mellitus without complications: Secondary | ICD-10-CM | POA: Diagnosis not present

## 2012-09-28 DIAGNOSIS — K922 Gastrointestinal hemorrhage, unspecified: Secondary | ICD-10-CM | POA: Diagnosis not present

## 2012-09-28 DIAGNOSIS — R609 Edema, unspecified: Secondary | ICD-10-CM | POA: Diagnosis not present

## 2012-09-28 DIAGNOSIS — K5289 Other specified noninfective gastroenteritis and colitis: Secondary | ICD-10-CM | POA: Diagnosis not present

## 2012-09-28 DIAGNOSIS — I10 Essential (primary) hypertension: Secondary | ICD-10-CM | POA: Diagnosis not present

## 2012-09-28 DIAGNOSIS — J449 Chronic obstructive pulmonary disease, unspecified: Secondary | ICD-10-CM | POA: Diagnosis not present

## 2012-09-29 DIAGNOSIS — M1610 Unilateral primary osteoarthritis, unspecified hip: Secondary | ICD-10-CM | POA: Diagnosis not present

## 2012-09-29 DIAGNOSIS — J449 Chronic obstructive pulmonary disease, unspecified: Secondary | ICD-10-CM | POA: Diagnosis not present

## 2012-09-29 DIAGNOSIS — E119 Type 2 diabetes mellitus without complications: Secondary | ICD-10-CM | POA: Diagnosis not present

## 2012-09-29 DIAGNOSIS — K5289 Other specified noninfective gastroenteritis and colitis: Secondary | ICD-10-CM | POA: Diagnosis not present

## 2012-09-29 DIAGNOSIS — K922 Gastrointestinal hemorrhage, unspecified: Secondary | ICD-10-CM | POA: Diagnosis not present

## 2012-09-29 DIAGNOSIS — I10 Essential (primary) hypertension: Secondary | ICD-10-CM | POA: Diagnosis not present

## 2012-10-03 DIAGNOSIS — I10 Essential (primary) hypertension: Secondary | ICD-10-CM | POA: Diagnosis not present

## 2012-10-03 DIAGNOSIS — Z87891 Personal history of nicotine dependence: Secondary | ICD-10-CM | POA: Diagnosis not present

## 2012-10-03 DIAGNOSIS — K259 Gastric ulcer, unspecified as acute or chronic, without hemorrhage or perforation: Secondary | ICD-10-CM | POA: Diagnosis not present

## 2012-10-03 DIAGNOSIS — E119 Type 2 diabetes mellitus without complications: Secondary | ICD-10-CM | POA: Diagnosis not present

## 2012-10-03 DIAGNOSIS — Q393 Congenital stenosis and stricture of esophagus: Secondary | ICD-10-CM | POA: Diagnosis not present

## 2012-10-03 DIAGNOSIS — Z883 Allergy status to other anti-infective agents status: Secondary | ICD-10-CM | POA: Diagnosis not present

## 2012-10-03 DIAGNOSIS — K219 Gastro-esophageal reflux disease without esophagitis: Secondary | ICD-10-CM | POA: Diagnosis not present

## 2012-10-03 DIAGNOSIS — J449 Chronic obstructive pulmonary disease, unspecified: Secondary | ICD-10-CM | POA: Diagnosis not present

## 2012-10-03 DIAGNOSIS — K449 Diaphragmatic hernia without obstruction or gangrene: Secondary | ICD-10-CM | POA: Diagnosis not present

## 2012-10-03 DIAGNOSIS — I509 Heart failure, unspecified: Secondary | ICD-10-CM | POA: Diagnosis not present

## 2012-10-03 DIAGNOSIS — Q391 Atresia of esophagus with tracheo-esophageal fistula: Secondary | ICD-10-CM | POA: Diagnosis not present

## 2012-10-03 DIAGNOSIS — G589 Mononeuropathy, unspecified: Secondary | ICD-10-CM | POA: Diagnosis not present

## 2012-10-03 DIAGNOSIS — E039 Hypothyroidism, unspecified: Secondary | ICD-10-CM | POA: Diagnosis not present

## 2012-10-03 DIAGNOSIS — K222 Esophageal obstruction: Secondary | ICD-10-CM | POA: Diagnosis not present

## 2012-10-05 DIAGNOSIS — M87059 Idiopathic aseptic necrosis of unspecified femur: Secondary | ICD-10-CM | POA: Diagnosis not present

## 2012-10-05 DIAGNOSIS — M169 Osteoarthritis of hip, unspecified: Secondary | ICD-10-CM | POA: Diagnosis not present

## 2012-10-05 DIAGNOSIS — IMO0002 Reserved for concepts with insufficient information to code with codable children: Secondary | ICD-10-CM | POA: Diagnosis not present

## 2012-10-05 DIAGNOSIS — Z981 Arthrodesis status: Secondary | ICD-10-CM | POA: Diagnosis not present

## 2012-10-06 DIAGNOSIS — Z23 Encounter for immunization: Secondary | ICD-10-CM | POA: Diagnosis not present

## 2012-12-04 DIAGNOSIS — L570 Actinic keratosis: Secondary | ICD-10-CM | POA: Diagnosis not present

## 2012-12-04 DIAGNOSIS — IMO0002 Reserved for concepts with insufficient information to code with codable children: Secondary | ICD-10-CM | POA: Diagnosis not present

## 2013-02-01 DIAGNOSIS — K529 Noninfective gastroenteritis and colitis, unspecified: Secondary | ICD-10-CM | POA: Diagnosis not present

## 2013-02-26 DIAGNOSIS — M169 Osteoarthritis of hip, unspecified: Secondary | ICD-10-CM | POA: Diagnosis not present

## 2013-02-26 DIAGNOSIS — M2170 Unequal limb length (acquired), unspecified site: Secondary | ICD-10-CM | POA: Diagnosis not present

## 2013-02-26 DIAGNOSIS — M87059 Idiopathic aseptic necrosis of unspecified femur: Secondary | ICD-10-CM | POA: Diagnosis not present

## 2013-02-27 DIAGNOSIS — M169 Osteoarthritis of hip, unspecified: Secondary | ICD-10-CM | POA: Diagnosis not present

## 2013-03-06 DIAGNOSIS — M87059 Idiopathic aseptic necrosis of unspecified femur: Secondary | ICD-10-CM | POA: Diagnosis not present

## 2013-03-06 DIAGNOSIS — M169 Osteoarthritis of hip, unspecified: Secondary | ICD-10-CM | POA: Diagnosis not present

## 2013-03-06 DIAGNOSIS — Z981 Arthrodesis status: Secondary | ICD-10-CM | POA: Diagnosis not present

## 2013-03-08 DIAGNOSIS — Z01818 Encounter for other preprocedural examination: Secondary | ICD-10-CM | POA: Diagnosis not present

## 2013-03-08 DIAGNOSIS — J449 Chronic obstructive pulmonary disease, unspecified: Secondary | ICD-10-CM | POA: Diagnosis not present

## 2013-03-08 DIAGNOSIS — E119 Type 2 diabetes mellitus without complications: Secondary | ICD-10-CM | POA: Diagnosis not present

## 2013-03-08 DIAGNOSIS — I10 Essential (primary) hypertension: Secondary | ICD-10-CM | POA: Diagnosis not present

## 2013-03-08 DIAGNOSIS — Z79899 Other long term (current) drug therapy: Secondary | ICD-10-CM | POA: Diagnosis not present

## 2013-03-08 DIAGNOSIS — K219 Gastro-esophageal reflux disease without esophagitis: Secondary | ICD-10-CM | POA: Diagnosis not present

## 2013-03-08 DIAGNOSIS — M169 Osteoarthritis of hip, unspecified: Secondary | ICD-10-CM | POA: Diagnosis not present

## 2013-03-13 DIAGNOSIS — K219 Gastro-esophageal reflux disease without esophagitis: Secondary | ICD-10-CM | POA: Diagnosis present

## 2013-03-13 DIAGNOSIS — Z91018 Allergy to other foods: Secondary | ICD-10-CM | POA: Diagnosis not present

## 2013-03-13 DIAGNOSIS — D62 Acute posthemorrhagic anemia: Secondary | ICD-10-CM | POA: Diagnosis not present

## 2013-03-13 DIAGNOSIS — M87059 Idiopathic aseptic necrosis of unspecified femur: Secondary | ICD-10-CM | POA: Diagnosis not present

## 2013-03-13 DIAGNOSIS — E039 Hypothyroidism, unspecified: Secondary | ICD-10-CM | POA: Diagnosis present

## 2013-03-13 DIAGNOSIS — F17200 Nicotine dependence, unspecified, uncomplicated: Secondary | ICD-10-CM | POA: Diagnosis not present

## 2013-03-13 DIAGNOSIS — Z886 Allergy status to analgesic agent status: Secondary | ICD-10-CM | POA: Diagnosis not present

## 2013-03-13 DIAGNOSIS — Z8673 Personal history of transient ischemic attack (TIA), and cerebral infarction without residual deficits: Secondary | ICD-10-CM | POA: Diagnosis not present

## 2013-03-13 DIAGNOSIS — M6281 Muscle weakness (generalized): Secondary | ICD-10-CM | POA: Diagnosis not present

## 2013-03-13 DIAGNOSIS — Z96649 Presence of unspecified artificial hip joint: Secondary | ICD-10-CM | POA: Diagnosis not present

## 2013-03-13 DIAGNOSIS — E871 Hypo-osmolality and hyponatremia: Secondary | ICD-10-CM | POA: Diagnosis not present

## 2013-03-13 DIAGNOSIS — Z883 Allergy status to other anti-infective agents status: Secondary | ICD-10-CM | POA: Diagnosis not present

## 2013-03-13 DIAGNOSIS — Z780 Asymptomatic menopausal state: Secondary | ICD-10-CM | POA: Diagnosis not present

## 2013-03-13 DIAGNOSIS — R279 Unspecified lack of coordination: Secondary | ICD-10-CM | POA: Diagnosis not present

## 2013-03-13 DIAGNOSIS — J449 Chronic obstructive pulmonary disease, unspecified: Secondary | ICD-10-CM | POA: Diagnosis present

## 2013-03-13 DIAGNOSIS — Z823 Family history of stroke: Secondary | ICD-10-CM | POA: Diagnosis not present

## 2013-03-13 DIAGNOSIS — R293 Abnormal posture: Secondary | ICD-10-CM | POA: Diagnosis not present

## 2013-03-13 DIAGNOSIS — Z981 Arthrodesis status: Secondary | ICD-10-CM | POA: Diagnosis not present

## 2013-03-13 DIAGNOSIS — Z888 Allergy status to other drugs, medicaments and biological substances status: Secondary | ICD-10-CM | POA: Diagnosis not present

## 2013-03-13 DIAGNOSIS — E119 Type 2 diabetes mellitus without complications: Secondary | ICD-10-CM | POA: Diagnosis not present

## 2013-03-13 DIAGNOSIS — M169 Osteoarthritis of hip, unspecified: Secondary | ICD-10-CM | POA: Diagnosis present

## 2013-03-13 DIAGNOSIS — F329 Major depressive disorder, single episode, unspecified: Secondary | ICD-10-CM | POA: Diagnosis present

## 2013-03-13 DIAGNOSIS — R269 Unspecified abnormalities of gait and mobility: Secondary | ICD-10-CM | POA: Diagnosis not present

## 2013-03-13 DIAGNOSIS — Z471 Aftercare following joint replacement surgery: Secondary | ICD-10-CM | POA: Diagnosis not present

## 2013-03-13 DIAGNOSIS — M1610 Unilateral primary osteoarthritis, unspecified hip: Secondary | ICD-10-CM | POA: Diagnosis not present

## 2013-03-13 DIAGNOSIS — Z5189 Encounter for other specified aftercare: Secondary | ICD-10-CM | POA: Diagnosis not present

## 2013-03-13 DIAGNOSIS — Z885 Allergy status to narcotic agent status: Secondary | ICD-10-CM | POA: Diagnosis not present

## 2013-03-13 DIAGNOSIS — D649 Anemia, unspecified: Secondary | ICD-10-CM | POA: Diagnosis not present

## 2013-03-13 DIAGNOSIS — R32 Unspecified urinary incontinence: Secondary | ICD-10-CM | POA: Diagnosis present

## 2013-03-13 DIAGNOSIS — I10 Essential (primary) hypertension: Secondary | ICD-10-CM | POA: Diagnosis not present

## 2013-03-13 DIAGNOSIS — K449 Diaphragmatic hernia without obstruction or gangrene: Secondary | ICD-10-CM | POA: Diagnosis present

## 2013-03-13 DIAGNOSIS — Z8249 Family history of ischemic heart disease and other diseases of the circulatory system: Secondary | ICD-10-CM | POA: Diagnosis not present

## 2013-03-13 DIAGNOSIS — Z800 Family history of malignant neoplasm of digestive organs: Secondary | ICD-10-CM | POA: Diagnosis not present

## 2013-03-13 DIAGNOSIS — E861 Hypovolemia: Secondary | ICD-10-CM | POA: Diagnosis present

## 2013-03-13 HISTORY — PX: JOINT REPLACEMENT: SHX530

## 2013-03-16 DIAGNOSIS — J449 Chronic obstructive pulmonary disease, unspecified: Secondary | ICD-10-CM | POA: Diagnosis not present

## 2013-03-16 DIAGNOSIS — M87059 Idiopathic aseptic necrosis of unspecified femur: Secondary | ICD-10-CM | POA: Diagnosis not present

## 2013-03-16 DIAGNOSIS — Z471 Aftercare following joint replacement surgery: Secondary | ICD-10-CM | POA: Diagnosis not present

## 2013-03-16 DIAGNOSIS — R293 Abnormal posture: Secondary | ICD-10-CM | POA: Diagnosis not present

## 2013-03-16 DIAGNOSIS — D649 Anemia, unspecified: Secondary | ICD-10-CM | POA: Diagnosis not present

## 2013-03-16 DIAGNOSIS — D62 Acute posthemorrhagic anemia: Secondary | ICD-10-CM | POA: Diagnosis not present

## 2013-03-16 DIAGNOSIS — T84029A Dislocation of unspecified internal joint prosthesis, initial encounter: Secondary | ICD-10-CM | POA: Diagnosis not present

## 2013-03-16 DIAGNOSIS — S73006A Unspecified dislocation of unspecified hip, initial encounter: Secondary | ICD-10-CM | POA: Diagnosis not present

## 2013-03-16 DIAGNOSIS — K449 Diaphragmatic hernia without obstruction or gangrene: Secondary | ICD-10-CM | POA: Diagnosis not present

## 2013-03-16 DIAGNOSIS — M169 Osteoarthritis of hip, unspecified: Secondary | ICD-10-CM | POA: Diagnosis not present

## 2013-03-16 DIAGNOSIS — R269 Unspecified abnormalities of gait and mobility: Secondary | ICD-10-CM | POA: Diagnosis not present

## 2013-03-16 DIAGNOSIS — S8290XD Unspecified fracture of unspecified lower leg, subsequent encounter for closed fracture with routine healing: Secondary | ICD-10-CM | POA: Diagnosis not present

## 2013-03-16 DIAGNOSIS — E119 Type 2 diabetes mellitus without complications: Secondary | ICD-10-CM | POA: Diagnosis not present

## 2013-03-16 DIAGNOSIS — I10 Essential (primary) hypertension: Secondary | ICD-10-CM | POA: Diagnosis not present

## 2013-03-16 DIAGNOSIS — Z4789 Encounter for other orthopedic aftercare: Secondary | ICD-10-CM | POA: Diagnosis not present

## 2013-03-16 DIAGNOSIS — Z96649 Presence of unspecified artificial hip joint: Secondary | ICD-10-CM | POA: Diagnosis not present

## 2013-03-16 DIAGNOSIS — E079 Disorder of thyroid, unspecified: Secondary | ICD-10-CM | POA: Diagnosis not present

## 2013-03-16 DIAGNOSIS — E871 Hypo-osmolality and hyponatremia: Secondary | ICD-10-CM | POA: Diagnosis not present

## 2013-03-16 DIAGNOSIS — E039 Hypothyroidism, unspecified: Secondary | ICD-10-CM | POA: Diagnosis not present

## 2013-03-16 DIAGNOSIS — Z5189 Encounter for other specified aftercare: Secondary | ICD-10-CM | POA: Diagnosis not present

## 2013-03-16 DIAGNOSIS — M6281 Muscle weakness (generalized): Secondary | ICD-10-CM | POA: Diagnosis not present

## 2013-03-16 DIAGNOSIS — R3915 Urgency of urination: Secondary | ICD-10-CM | POA: Diagnosis not present

## 2013-03-16 DIAGNOSIS — Z87891 Personal history of nicotine dependence: Secondary | ICD-10-CM | POA: Diagnosis not present

## 2013-03-16 DIAGNOSIS — Z780 Asymptomatic menopausal state: Secondary | ICD-10-CM | POA: Diagnosis not present

## 2013-03-16 DIAGNOSIS — T84099A Other mechanical complication of unspecified internal joint prosthesis, initial encounter: Secondary | ICD-10-CM | POA: Diagnosis not present

## 2013-03-16 DIAGNOSIS — M549 Dorsalgia, unspecified: Secondary | ICD-10-CM | POA: Diagnosis not present

## 2013-03-16 DIAGNOSIS — R279 Unspecified lack of coordination: Secondary | ICD-10-CM | POA: Diagnosis not present

## 2013-03-16 DIAGNOSIS — G8929 Other chronic pain: Secondary | ICD-10-CM | POA: Diagnosis not present

## 2013-03-16 DIAGNOSIS — T148XXA Other injury of unspecified body region, initial encounter: Secondary | ICD-10-CM | POA: Diagnosis not present

## 2013-03-16 DIAGNOSIS — M25559 Pain in unspecified hip: Secondary | ICD-10-CM | POA: Diagnosis not present

## 2013-03-16 DIAGNOSIS — Z79899 Other long term (current) drug therapy: Secondary | ICD-10-CM | POA: Diagnosis not present

## 2013-04-16 ENCOUNTER — Emergency Department (HOSPITAL_COMMUNITY)
Admission: EM | Admit: 2013-04-16 | Discharge: 2013-04-17 | Disposition: A | Discharge status: Other Acute Inpatient Hospital | Payer: Medicare Other | Attending: Emergency Medicine | Admitting: Emergency Medicine

## 2013-04-16 ENCOUNTER — Emergency Department (HOSPITAL_COMMUNITY): Payer: Medicare Other

## 2013-04-16 ENCOUNTER — Emergency Department (HOSPITAL_COMMUNITY)
Admission: EM | Admit: 2013-04-16 | Discharge: 2013-04-16 | Disposition: A | Discharge status: Home / Self Care | Payer: Medicare Other | Source: Home / Self Care | Attending: Emergency Medicine | Admitting: Emergency Medicine

## 2013-04-16 ENCOUNTER — Encounter (HOSPITAL_COMMUNITY): Payer: Self-pay | Admitting: Emergency Medicine

## 2013-04-16 ENCOUNTER — Encounter (HOSPITAL_COMMUNITY): Payer: Self-pay | Admitting: *Deleted

## 2013-04-16 DIAGNOSIS — M549 Dorsalgia, unspecified: Secondary | ICD-10-CM | POA: Insufficient documentation

## 2013-04-16 DIAGNOSIS — T84029A Dislocation of unspecified internal joint prosthesis, initial encounter: Secondary | ICD-10-CM | POA: Insufficient documentation

## 2013-04-16 DIAGNOSIS — J449 Chronic obstructive pulmonary disease, unspecified: Secondary | ICD-10-CM | POA: Insufficient documentation

## 2013-04-16 DIAGNOSIS — X500XXA Overexertion from strenuous movement or load, initial encounter: Secondary | ICD-10-CM | POA: Insufficient documentation

## 2013-04-16 DIAGNOSIS — G8929 Other chronic pain: Secondary | ICD-10-CM | POA: Insufficient documentation

## 2013-04-16 DIAGNOSIS — Z96649 Presence of unspecified artificial hip joint: Secondary | ICD-10-CM | POA: Insufficient documentation

## 2013-04-16 DIAGNOSIS — Z87891 Personal history of nicotine dependence: Secondary | ICD-10-CM | POA: Insufficient documentation

## 2013-04-16 DIAGNOSIS — J4489 Other specified chronic obstructive pulmonary disease: Secondary | ICD-10-CM | POA: Insufficient documentation

## 2013-04-16 DIAGNOSIS — E079 Disorder of thyroid, unspecified: Secondary | ICD-10-CM | POA: Insufficient documentation

## 2013-04-16 DIAGNOSIS — Y9389 Activity, other specified: Secondary | ICD-10-CM | POA: Insufficient documentation

## 2013-04-16 DIAGNOSIS — Z79899 Other long term (current) drug therapy: Secondary | ICD-10-CM | POA: Insufficient documentation

## 2013-04-16 DIAGNOSIS — Y9210 Unspecified residential institution as the place of occurrence of the external cause: Secondary | ICD-10-CM | POA: Insufficient documentation

## 2013-04-16 DIAGNOSIS — S73004A Unspecified dislocation of right hip, initial encounter: Secondary | ICD-10-CM

## 2013-04-16 DIAGNOSIS — Z90710 Acquired absence of both cervix and uterus: Secondary | ICD-10-CM | POA: Insufficient documentation

## 2013-04-16 DIAGNOSIS — Y92009 Unspecified place in unspecified non-institutional (private) residence as the place of occurrence of the external cause: Secondary | ICD-10-CM | POA: Insufficient documentation

## 2013-04-16 DIAGNOSIS — IMO0002 Reserved for concepts with insufficient information to code with codable children: Secondary | ICD-10-CM | POA: Insufficient documentation

## 2013-04-16 DIAGNOSIS — I10 Essential (primary) hypertension: Secondary | ICD-10-CM | POA: Insufficient documentation

## 2013-04-16 HISTORY — DX: Chronic obstructive pulmonary disease, unspecified: J44.9

## 2013-04-16 HISTORY — DX: Disorder of thyroid, unspecified: E07.9

## 2013-04-16 HISTORY — DX: Essential (primary) hypertension: I10

## 2013-04-16 MED ORDER — PROPOFOL 10 MG/ML IV BOLUS
50.0000 mg | Freq: Once | INTRAVENOUS | Status: AC
  Administered 2013-04-16: 50 mg via INTRAVENOUS

## 2013-04-16 MED ORDER — PROPOFOL 10 MG/ML IV BOLUS
40.0000 mg | Freq: Once | INTRAVENOUS | Status: AC
  Filled 2013-04-16: qty 1

## 2013-04-16 MED ORDER — HYDROMORPHONE HCL PF 1 MG/ML IJ SOLN
INTRAMUSCULAR | Status: AC
  Administered 2013-04-16: 1 mg via INTRAVENOUS
  Filled 2013-04-16: qty 1

## 2013-04-16 MED ORDER — PROPOFOL 10 MG/ML IV BOLUS
INTRAVENOUS | Status: AC | PRN
  Administered 2013-04-16: 40 mg via INTRAVENOUS
  Administered 2013-04-16 (×3): 20 mg via INTRAVENOUS

## 2013-04-16 MED ORDER — NALOXONE HCL 0.4 MG/ML IJ SOLN
INTRAMUSCULAR | Status: AC
  Filled 2013-04-16: qty 1

## 2013-04-16 MED ORDER — HYDROMORPHONE HCL PF 1 MG/ML IJ SOLN
1.0000 mg | Freq: Once | INTRAMUSCULAR | Status: AC
  Administered 2013-04-16: 1 mg via INTRAVENOUS

## 2013-04-16 MED ORDER — HYDROMORPHONE HCL PF 1 MG/ML IJ SOLN
1.0000 mg | Freq: Once | INTRAMUSCULAR | Status: AC
  Administered 2013-04-16: 1 mg via INTRAVENOUS
  Filled 2013-04-16: qty 1

## 2013-04-16 MED ORDER — PROPOFOL 10 MG/ML IV BOLUS
0.5000 mg/kg | Freq: Once | INTRAVENOUS | Status: DC
  Filled 2013-04-16: qty 1

## 2013-04-16 MED ORDER — HYDROMORPHONE HCL PF 1 MG/ML IJ SOLN
0.5000 mg | Freq: Once | INTRAMUSCULAR | Status: AC
  Administered 2013-04-16: 0.5 mg via INTRAVENOUS
  Filled 2013-04-16: qty 1

## 2013-04-16 NOTE — ED Notes (Signed)
Dr. Bebe Shaggy placing hip back in place.

## 2013-04-16 NOTE — ED Provider Notes (Signed)
History     CSN: [id number]  Arrival date & time 04/16/13  0159   First MD Initiated Contact with Patient 04/16/13 0207      Chief Complaint  Patient presents with  . Hip Pain    Patient is a 72 y.o. female presenting with hip pain. The history is provided by the patient.  Hip Pain This is a new problem. The current episode started less than 1 hour ago. The problem occurs constantly. The problem has been gradually worsening. Pertinent negatives include no chest pain, no abdominal pain, no headaches and no shortness of breath. Exacerbated by: movement. The symptoms are relieved by rest. She has tried rest (narcotics) for the symptoms. The treatment provided no relief.  pt reports she was getting out of bed and bed was too high and she felt her right hip "pop" out of place She did not fall  No head injury No new neck or back pain No focal weakness in her legs   Past Medical History  Diagnosis Date  . COPD (chronic obstructive pulmonary disease)   . Hypertension   . Thyroid disease     Past Surgical History  Procedure Laterality Date  . Abdominal hysterectomy    . Cholecystectomy    . Ankle fracture surgery    . Back surgery      History reviewed. No pertinent family history.  History  Substance Use Topics  . Smoking status: Former Games developer  . Smokeless tobacco: Not on file  . Alcohol Use: No    OB History   Grav Para Term Preterm Abortions TAB SAB Ect Mult Living                  Review of Systems  Respiratory: Negative for shortness of breath.   Cardiovascular: Negative for chest pain.  Gastrointestinal: Negative for abdominal pain.  Musculoskeletal:       Chronic back pain   Neurological: Negative for weakness and headaches.  All other systems reviewed and are negative.    Allergies  Antivert; Codeine; and Erythromycin  Home Medications   Current Outpatient Rx  Name  Route  Sig  Dispense  Refill  . acetaminophen (TYLENOL) 325 MG tablet   Oral  Take 650 mg by mouth every 6 (six) hours as needed for pain.         . busPIRone (BUSPAR) 30 MG tablet   Oral   Take 30 mg by mouth 2 (two) times daily.         . Cholecalciferol (VITAMIN D-3) 1000 UNITS CAPS   Oral   Take by mouth.         . cyclobenzaprine (FLEXERIL) 10 MG tablet   Oral   Take 10 mg by mouth 3 (three) times daily as needed for muscle spasms.         . Darifenacin Hydrobromide (ENABLEX PO)   Oral   Take 15 mg by mouth.         . docusate sodium (COLACE) 100 MG capsule   Oral   Take 100 mg by mouth 2 (two) times daily.         Marland Kitchen gemfibrozil (LOPID) 600 MG tablet   Oral   Take 600 mg by mouth 2 (two) times daily before a meal.         . glimepiride (AMARYL) 2 MG tablet   Oral   Take 2 mg by mouth daily before breakfast.         . isosorbide  dinitrate (ISORDIL) 30 MG tablet   Oral   Take 30 mg by mouth 4 (four) times daily.         Marland Kitchen labetalol (NORMODYNE) 200 MG tablet   Oral   Take 200 mg by mouth 2 (two) times daily.         Marland Kitchen levothyroxine (SYNTHROID, LEVOTHROID) 100 MCG tablet   Oral   Take 100 mcg by mouth daily before breakfast.         . lisinopril (PRINIVIL,ZESTRIL) 40 MG tablet   Oral   Take 40 mg by mouth daily.         . Multiple Vitamin (MULTIVITAMIN) tablet   Oral   Take 1 tablet by mouth daily.         . nitrofurantoin (MACRODANTIN) 100 MG capsule   Oral   Take 100 mg by mouth 4 (four) times daily.         Marland Kitchen oxyCODONE-acetaminophen (PERCOCET/ROXICET) 5-325 MG per tablet   Oral   Take 1 tablet by mouth every 6 (six) hours as needed for pain.         . pantoprazole (PROTONIX) 40 MG tablet   Oral   Take 40 mg by mouth daily.         . polyethylene glycol (MIRALAX / GLYCOLAX) packet   Oral   Take 17 g by mouth daily.         . predniSONE (DELTASONE) 20 MG tablet   Oral   Take 20 mg by mouth daily.         Marland Kitchen tolterodine (DETROL LA) 4 MG 24 hr capsule   Oral   Take 4 mg by mouth daily.          . vitamin C (ASCORBIC ACID) 500 MG tablet   Oral   Take 500 mg by mouth daily.         Marland Kitchen zolpidem (AMBIEN) 5 MG tablet   Oral   Take 5 mg by mouth at bedtime as needed for sleep.           BP 184/98  Pulse 92  Temp(Src) 98 F (36.7 C) (Oral)  Resp 24  Ht 5\' 2"  (1.575 m)  Wt 177 lb (80.287 kg)  BMI 32.37 kg/m2  SpO2 97%  Physical Exam CONSTITUTIONAL: Well developed/well nourished, anxious HEAD: Normocephalic/atraumatic EYES: EOMI/PERRL ENMT: Mucous membranes moist NECK: supple no meningeal signs SPINE:no cervical spine tenderness CV: S1/S2 noted, no murmurs/rubs/gallops noted LUNGS: Lungs are clear to auscultation bilaterally, no apparent distress ABDOMEN: soft, nontender, no rebound or guarding GU:no cva tenderness NEURO: Pt is awake/alert, moves all extremitiesx4 EXTREMITIES: pulses normal/equal in bilateral LE, she is maintaining right hip in flexion, well healed incision to right lateral hip.  No right knee/ankle tenderness.  She can move toes/foot on right LE and no sensory deficit noted to right foot SKIN: warm, color normal PSYCH: anxious  ED Course  Reduction of dislocation Date/Time: 04/16/2013 4:56 AM Performed by: Joya Gaskins Authorized by: Joya Gaskins Consent: Verbal consent obtained. written consent obtained. Risks and benefits: risks, benefits and alternatives were discussed Consent given by: patient Patient identity confirmed: verbally with patient, arm band and provided demographic data Time out: Immediately prior to procedure a "time out" was called to verify the correct patient, procedure, equipment, support staff and site/side marked as required. Patient sedated: yes Sedatives: see MAR for details Vitals: Vital signs were monitored during sedation. Patient tolerance: Patient tolerated the procedure well with no immediate complications.  Comments: Closed reduction of dislocation of right prosthetic hip with "captain morgan  method" which consisted of assistant applying downward pressure to pelvis while her right hip was flexed and her knee was flexed over my knee.  I plantar flexed my foot and hip was easily reduced Pt tolerated well Distal pulses equal/intact after procedure      Procedural sedation Performed by: Joya Gaskins Consent: Verbal consent obtained. Risks and benefits: risks, benefits and alternatives were discussed Required items: required blood products, implants, devices, and special equipment available Patient identity confirmed: arm band and provided demographic data Time out: Immediately prior to procedure a "time out" was called to verify the correct patient, procedure, equipment, support staff and site/side marked as required.  Sedation type: moderate (conscious) sedation NPO time confirmed and considedered  Sedatives: PROPOFOL  Physician Time at Bedside: 18  Vitals: Vital signs were monitored during sedation. Cardiac Monitor, pulse oximeter and ETCO2 Patient tolerance: Patient tolerated the procedure well but she did have episode of brief hypoxia that required usage of bag-valve mask, and hypoxia resolved immediately.   Comments: Pt with uneventful recovery. Returned to pre-procedural sedation baseline     Labs Reviewed - No data to display Dg Hip 1 View Right  04/16/2013   *RADIOLOGY REPORT*  Clinical Data: Dislocation of right hip while in bed.  Recent right hip replacement.  RIGHT HIP - 1 VIEW  Comparison: Right hip radiographs performed 03/13/2013  Findings: There is superior dislocation of the patient's right hip prosthesis.  Evaluation is suboptimal due to limitations in penetration.  No definite fractures are identified, though the right ilium is not well assessed.  No significant soft tissue abnormalities are characterized on radiograph.  IMPRESSION: Superior dislocation of the patient's right hip prosthesis. Evaluation suboptimal, with limited visualization of the osseous  structures.   Original Report Authenticated By: Tonia Ghent, M.D.    3:00 AM Pt presents with right prosthetic hip dislocation She had repair last month at [facility] I have placed call to her orthopedist Dr Case 3:36 AM Still awaiting call back from her orthopedist in [facility] 4:01 AM I updated patient on plan and awaiting call back from orthopedist I have placed call to local orthopedist for guidance 4:16 AM I spoke to dr Romeo Apple He feels that she should be a good candidate for closed reduction in the ED If I am unsuccessful, he reports I can call him back for assistance 5:11 AM Pt now back to baseline Awake/alert, no distress, no hypoxia I advised need to followup with her orthopedist and use of knee immobilizer and to use crutches/walker for NWB purposes until she sees her orthopedist  MDM  Nursing notes including past medical history and social history reviewed and considered in documentation xrays reviewed and considered         Joya Gaskins, MD 04/16/13 [id number]

## 2013-04-16 NOTE — ED Provider Notes (Addendum)
History  This chart was scribed for [name], MD by Bennett Scrape, ED Scribe. This patient was seen in room APA01/APA01 and the patient's care was started at 9:13 PM.  CSN: [id number]  Arrival date & time 04/16/13  2048   First MD Initiated Contact with Patient 04/16/13 2113      Chief Complaint  Patient presents with  . Hip dislocated     Patient is a 72 y.o. female presenting with hip pain. The history is provided by the patient. No language interpreter was used.  Hip Pain This is a recurrent problem. The current episode started less than 1 hour ago. The problem occurs constantly. The problem has not changed since onset.Nothing relieves the symptoms. She has tried nothing for the symptoms.    HPI Comments: Mary Moody is a 72 y.o. female brought in by ambulance, who presents to the Emergency Department complaining of sudden onset, non-changing, constant right hip pain that started when she shifted on the urinal and felt the hip dislocate. Pt reports that she has prior dislocations, most recent episode was 8 hours ago. The hip was reduced in the ED with no complications. Last time she ate or drank was 3 hours ago. She denies any other injuries or symptoms currently. She denies any head injury, new neck or back pain and weakness. She has a h/o COPD, HTN and thyroid disease. Pt is a former smoker but denies alcohol use.  Past Medical History  Diagnosis Date  . COPD (chronic obstructive pulmonary disease)   . Hypertension   . Thyroid disease     Past Surgical History  Procedure Laterality Date  . Abdominal hysterectomy    . Cholecystectomy    . Ankle fracture surgery    . Back surgery      History reviewed. No pertinent family history.  History  Substance Use Topics  . Smoking status: Former Games developer  . Smokeless tobacco: Not on file  . Alcohol Use: No    No OB history provided.  Review of Systems  HENT: Negative for neck pain.   Musculoskeletal: Positive  for back pain (chronic) and arthralgias (right hip).  Neurological: Negative for weakness and numbness.  All other systems reviewed and are negative.    Allergies  Antivert; Codeine; and Erythromycin  Home Medications   Current Outpatient Rx  Name  Route  Sig  Dispense  Refill  . acetaminophen (TYLENOL) 325 MG tablet   Oral   Take 650 mg by mouth every 6 (six) hours as needed for pain.         Marland Kitchen amLODipine (NORVASC) 10 MG tablet   Oral   Take 10 mg by mouth daily.         . busPIRone (BUSPAR) 30 MG tablet   Oral   Take 30 mg by mouth 2 (two) times daily.         . calcium-vitamin D (OSCAL WITH D) 500-200 MG-UNIT per tablet   Oral   Take 1 tablet by mouth 3 (three) times daily.         . Cholecalciferol (VITAMIN D-3) 1000 UNITS CAPS   Oral   Take 1,000 Units by mouth daily.          . cyclobenzaprine (FLEXERIL) 10 MG tablet   Oral   Take 10 mg by mouth every 8 (eight) hours as needed for muscle spasms.          Marland Kitchen darifenacin (ENABLEX) 15 MG 24 hr tablet  Oral   Take 15 mg by mouth daily.         Marland Kitchen docusate sodium (COLACE) 100 MG capsule   Oral   Take 100 mg by mouth 2 (two) times daily.         Marland Kitchen gemfibrozil (LOPID) 600 MG tablet   Oral   Take 600 mg by mouth 2 (two) times daily before a meal.         . glimepiride (AMARYL) 2 MG tablet   Oral   Take 2 mg by mouth daily before breakfast.         . isosorbide dinitrate (ISORDIL) 30 MG tablet   Oral   Take 30 mg by mouth 4 (four) times daily.         Marland Kitchen labetalol (NORMODYNE) 200 MG tablet   Oral   Take 200 mg by mouth 2 (two) times daily.         Marland Kitchen levothyroxine (SYNTHROID, LEVOTHROID) 100 MCG tablet   Oral   Take 100 mcg by mouth daily before breakfast.         . lisinopril (PRINIVIL,ZESTRIL) 10 MG tablet   Oral   Take 10 mg by mouth daily.         . Multiple Vitamin (MULTIVITAMIN) tablet   Oral   Take 1 tablet by mouth daily.         Marland Kitchen oxyCODONE-acetaminophen  (PERCOCET/ROXICET) 5-325 MG per tablet   Oral   Take 1 tablet by mouth 4 (four) times daily as needed for pain.          . pantoprazole (PROTONIX) 40 MG tablet   Oral   Take 40 mg by mouth daily.         . polyethylene glycol (MIRALAX / GLYCOLAX) packet   Oral   Take 17 g by mouth daily.         . predniSONE (DELTASONE) 20 MG tablet   Oral   Take 20 mg by mouth daily.         . nitrofurantoin (MACRODANTIN) 100 MG capsule   Oral   Take 100 mg by mouth 2 (two) times daily.            Triage Vitals: BP 163/89  Pulse 103  Temp(Src) 98.6 F (37 C) (Oral)  Resp 20  Ht 5\' 2"  (1.575 m)  Wt 177 lb (80.287 kg)  BMI 32.37 kg/m2  SpO2 96%  Physical Exam  Nursing note and vitals reviewed. Constitutional: She is oriented to person, place, and time. She appears well-developed and well-nourished. No distress.  HENT:  Head: Normocephalic and atraumatic.  Eyes: EOM are normal.  Neck: Neck supple. No tracheal deviation present.  Cardiovascular: Normal rate.   Pulmonary/Chest: Effort normal. No respiratory distress.  Musculoskeletal: Normal range of motion.  Shortened and externally rotated right leg, good pulses  Neurological: She is alert and oriented to person, place, and time.  Sensation is intact  Skin: Skin is warm and dry.  Psychiatric: She has a normal mood and affect. Her behavior is normal.    ED Course  Reduction of dislocation Date/Time: 04/16/2013 11:15 PM Performed by: [name] Authorized by: Ranae Palms, Takahiro Godinho Consent: Verbal consent obtained. written consent obtained. Consent given by: patient Patient understanding: patient states understanding of the procedure being performed Patient consent: the patient's understanding of the procedure matches consent given Procedure consent: procedure consent matches procedure scheduled Imaging studies: imaging studies available Patient identity confirmed: verbally with patient and arm band Time out:  Immediately prior to procedure a "time out" was called to verify the correct patient, procedure, equipment, support staff and site/side marked as required. Local anesthesia used: no Patient sedated: yes Sedatives: propofol and see MAR for details Sedation start date/time: 04/16/2013 11:01 PM Sedation end date/time: 04/16/2013 11:10 PM Vitals: Vital signs were monitored during sedation. Comments: Reduction of Right hip with traction/countertraction. Pt tolerated well. No desaturation.    (including critical care time)  Medications  HYDROmorphone (DILAUDID) injection 0.5 mg (not administered)  HYDROmorphone (DILAUDID) injection 0.5 mg (0.5 mg Intravenous Given 04/16/13 2139)  propofol (DIPRIVAN) 10 mg/mL bolus/IV push 40 mg (0 mg Intravenous Duplicate 04/16/13 2245)  propofol (DIPRIVAN) 10 mg/mL bolus/IV push ( Intravenous Stopped 04/16/13 2310)    DIAGNOSTIC STUDIES: Oxygen Saturation is 96% on room air, normal by my interpretation.    COORDINATION OF CARE: 9:24 PM-Discussed treatment plan which includes pain medication and xray of right hip with pt at bedside and pt agreed to plan. Advised pt that she needs to follow up with ortho and pt agreed.  Labs Reviewed - No data to display Dg Hip 1 View Right  04/16/2013   *RADIOLOGY REPORT*  Clinical Data: Status post reduction of right hip dislocation.  RIGHT HIP - 1 VIEW  Comparison: Right hip radiographs performed earlier today at 02:20 a.m.  Findings: There has been successful reduction of the patient's right hip dislocation.  The right hip prosthesis is grossly unremarkable in appearance, though incompletely imaged.  No acute osseous abnormalities are seen.  No significant soft tissue abnormalities are characterized on radiograph.  IMPRESSION: Successful reduction of right hip dislocation.  No evidence of fracture.   Original Report Authenticated By: Tonia Ghent, M.D.   Dg Hip 1 View Right  04/16/2013   *RADIOLOGY REPORT*  Clinical Data:  Dislocation of right hip while in bed.  Recent right hip replacement.  RIGHT HIP - 1 VIEW  Comparison: Right hip radiographs performed 03/13/2013  Findings: There is superior dislocation of the patient's right hip prosthesis.  Evaluation is suboptimal due to limitations in penetration.  No definite fractures are identified, though the right ilium is not well assessed.  No significant soft tissue abnormalities are characterized on radiograph.  IMPRESSION: Superior dislocation of the patient's right hip prosthesis. Evaluation suboptimal, with limited visualization of the osseous structures.   Original Report Authenticated By: Tonia Ghent, M.D.   Dg Hip Complete Right  04/16/2013   *RADIOLOGY REPORT*  Clinical Data: Concern for right hip dislocation.  RIGHT HIP - COMPLETE 2+ VIEW  Comparison: Right hip radiographs performed earlier today at 04:45 a.m.  Findings: There is superior dislocation of the patient's right hip prosthesis.  No associated fracture is seen.  There is no evidence of loosening of hardware.  The left hip joint is incompletely assessed, but appears grossly unremarkable.  Lumbosacral spinal hardware is difficult to fully characterize.  The visualized bowel gas pattern is grossly unremarkable in appearance.  IMPRESSION: Superior dislocation of right hip prosthesis; no associated fracture seen.  No evidence of loosening of hardware.   Original Report Authenticated By: Tonia Ghent, M.D.     1. Closed dislocation of right hip, initial encounter       MDM  I personally performed the services described in this documentation, which was scribed in my presence. The recorded information has been reviewed and is accurate. Repeat films show persistent R hip dislocation. Unsure whether dislocated during xray or never reduced. Discussed with Dr Chaney Malling who is on call for  Dr Case. He has accepted pt is transfer to [facility].     [name], MD 04/16/13 2349  [name],  MD 05/02/13 [phone]

## 2013-04-16 NOTE — ED Notes (Signed)
Pt slid down in the bed and her hip popped out of place. Pt says it happened yesterday as well but popped back into place. Pt had hip replacement April 22nd, 2014. Pt received 10 mg of morphine en route via ems.

## 2013-04-16 NOTE — ED Notes (Signed)
Knee immobilizer applied

## 2013-04-16 NOTE — ED Notes (Signed)
Patient states she was trying to get out of bed and felt her right hip "pop out." States she just had hip replacement surgery last month.

## 2013-04-16 NOTE — ED Notes (Signed)
Patient given female urinal, tolerated well. 

## 2013-04-16 NOTE — ED Notes (Signed)
Patient alert and oriented at baseline. Requesting pain medication. Vital signs within normal limits.

## 2013-04-16 NOTE — ED Notes (Signed)
Pt is from [facility] and was seen here early this am for hip dislocation, was put back by dr Bebe Shaggy and sent back.  Pt returns again for hip is out.

## 2013-04-17 DIAGNOSIS — M25559 Pain in unspecified hip: Secondary | ICD-10-CM | POA: Diagnosis not present

## 2013-04-17 DIAGNOSIS — T84029A Dislocation of unspecified internal joint prosthesis, initial encounter: Secondary | ICD-10-CM | POA: Diagnosis not present

## 2013-04-17 DIAGNOSIS — J449 Chronic obstructive pulmonary disease, unspecified: Secondary | ICD-10-CM | POA: Diagnosis not present

## 2013-04-17 DIAGNOSIS — X58XXXA Exposure to other specified factors, initial encounter: Secondary | ICD-10-CM | POA: Diagnosis not present

## 2013-04-17 DIAGNOSIS — E119 Type 2 diabetes mellitus without complications: Secondary | ICD-10-CM | POA: Diagnosis not present

## 2013-04-17 DIAGNOSIS — Z883 Allergy status to other anti-infective agents status: Secondary | ICD-10-CM | POA: Diagnosis not present

## 2013-04-17 DIAGNOSIS — E039 Hypothyroidism, unspecified: Secondary | ICD-10-CM | POA: Diagnosis not present

## 2013-04-17 DIAGNOSIS — K449 Diaphragmatic hernia without obstruction or gangrene: Secondary | ICD-10-CM | POA: Diagnosis not present

## 2013-04-17 DIAGNOSIS — Z888 Allergy status to other drugs, medicaments and biological substances status: Secondary | ICD-10-CM | POA: Diagnosis not present

## 2013-04-17 DIAGNOSIS — R059 Cough, unspecified: Secondary | ICD-10-CM | POA: Diagnosis not present

## 2013-04-17 DIAGNOSIS — M549 Dorsalgia, unspecified: Secondary | ICD-10-CM | POA: Diagnosis not present

## 2013-04-17 DIAGNOSIS — S73006A Unspecified dislocation of unspecified hip, initial encounter: Secondary | ICD-10-CM | POA: Diagnosis not present

## 2013-04-17 DIAGNOSIS — F411 Generalized anxiety disorder: Secondary | ICD-10-CM | POA: Diagnosis not present

## 2013-04-17 DIAGNOSIS — Z96649 Presence of unspecified artificial hip joint: Secondary | ICD-10-CM | POA: Diagnosis not present

## 2013-04-17 DIAGNOSIS — Z800 Family history of malignant neoplasm of digestive organs: Secondary | ICD-10-CM | POA: Diagnosis not present

## 2013-04-17 DIAGNOSIS — Z79899 Other long term (current) drug therapy: Secondary | ICD-10-CM | POA: Diagnosis not present

## 2013-04-17 DIAGNOSIS — Z87891 Personal history of nicotine dependence: Secondary | ICD-10-CM | POA: Diagnosis not present

## 2013-04-17 DIAGNOSIS — E785 Hyperlipidemia, unspecified: Secondary | ICD-10-CM | POA: Diagnosis not present

## 2013-04-17 DIAGNOSIS — Z471 Aftercare following joint replacement surgery: Secondary | ICD-10-CM | POA: Diagnosis not present

## 2013-04-17 DIAGNOSIS — T148XXA Other injury of unspecified body region, initial encounter: Secondary | ICD-10-CM | POA: Diagnosis not present

## 2013-04-17 DIAGNOSIS — Z885 Allergy status to narcotic agent status: Secondary | ICD-10-CM | POA: Diagnosis not present

## 2013-04-17 DIAGNOSIS — Z4789 Encounter for other orthopedic aftercare: Secondary | ICD-10-CM | POA: Diagnosis not present

## 2013-04-17 DIAGNOSIS — I10 Essential (primary) hypertension: Secondary | ICD-10-CM | POA: Diagnosis not present

## 2013-04-17 DIAGNOSIS — R05 Cough: Secondary | ICD-10-CM | POA: Diagnosis not present

## 2013-04-17 DIAGNOSIS — T84099A Other mechanical complication of unspecified internal joint prosthesis, initial encounter: Secondary | ICD-10-CM | POA: Diagnosis not present

## 2013-04-17 DIAGNOSIS — R0989 Other specified symptoms and signs involving the circulatory and respiratory systems: Secondary | ICD-10-CM | POA: Diagnosis not present

## 2013-04-17 MED ORDER — HYDROMORPHONE HCL PF 1 MG/ML IJ SOLN: 1.0000 mg | Freq: Once | INTRAMUSCULAR | Status: AC

## 2013-04-17 MED ORDER — HYDROMORPHONE HCL PF 1 MG/ML IJ SOLN
0.5000 mg | Freq: Once | INTRAMUSCULAR | Status: AC
  Administered 2013-04-17: 0.5 mg via INTRAVENOUS
  Filled 2013-04-17: qty 1

## 2013-04-17 MED ORDER — HYDROMORPHONE HCL PF 1 MG/ML IJ SOLN
INTRAMUSCULAR | Status: AC
  Administered 2013-04-17: 1 mg via INTRAVENOUS
  Filled 2013-04-17: qty 1

## 2013-04-18 DIAGNOSIS — K449 Diaphragmatic hernia without obstruction or gangrene: Secondary | ICD-10-CM | POA: Diagnosis present

## 2013-04-18 DIAGNOSIS — R293 Abnormal posture: Secondary | ICD-10-CM | POA: Diagnosis not present

## 2013-04-18 DIAGNOSIS — X58XXXA Exposure to other specified factors, initial encounter: Secondary | ICD-10-CM | POA: Diagnosis not present

## 2013-04-18 DIAGNOSIS — E119 Type 2 diabetes mellitus without complications: Secondary | ICD-10-CM | POA: Diagnosis present

## 2013-04-18 DIAGNOSIS — K219 Gastro-esophageal reflux disease without esophagitis: Secondary | ICD-10-CM | POA: Diagnosis not present

## 2013-04-18 DIAGNOSIS — M62838 Other muscle spasm: Secondary | ICD-10-CM | POA: Diagnosis not present

## 2013-04-18 DIAGNOSIS — R269 Unspecified abnormalities of gait and mobility: Secondary | ICD-10-CM | POA: Diagnosis not present

## 2013-04-18 DIAGNOSIS — S8290XD Unspecified fracture of unspecified lower leg, subsequent encounter for closed fracture with routine healing: Secondary | ICD-10-CM | POA: Diagnosis not present

## 2013-04-18 DIAGNOSIS — R279 Unspecified lack of coordination: Secondary | ICD-10-CM | POA: Diagnosis not present

## 2013-04-18 DIAGNOSIS — R52 Pain, unspecified: Secondary | ICD-10-CM | POA: Diagnosis not present

## 2013-04-18 DIAGNOSIS — Z780 Asymptomatic menopausal state: Secondary | ICD-10-CM | POA: Diagnosis not present

## 2013-04-18 DIAGNOSIS — D649 Anemia, unspecified: Secondary | ICD-10-CM | POA: Diagnosis not present

## 2013-04-18 DIAGNOSIS — E871 Hypo-osmolality and hyponatremia: Secondary | ICD-10-CM | POA: Diagnosis not present

## 2013-04-18 DIAGNOSIS — Z91018 Allergy to other foods: Secondary | ICD-10-CM | POA: Diagnosis not present

## 2013-04-18 DIAGNOSIS — M6281 Muscle weakness (generalized): Secondary | ICD-10-CM | POA: Diagnosis not present

## 2013-04-18 DIAGNOSIS — Z888 Allergy status to other drugs, medicaments and biological substances status: Secondary | ICD-10-CM | POA: Diagnosis not present

## 2013-04-18 DIAGNOSIS — S73016A Posterior dislocation of unspecified hip, initial encounter: Secondary | ICD-10-CM | POA: Diagnosis not present

## 2013-04-18 DIAGNOSIS — R32 Unspecified urinary incontinence: Secondary | ICD-10-CM | POA: Diagnosis present

## 2013-04-18 DIAGNOSIS — Z800 Family history of malignant neoplasm of digestive organs: Secondary | ICD-10-CM | POA: Diagnosis not present

## 2013-04-18 DIAGNOSIS — E785 Hyperlipidemia, unspecified: Secondary | ICD-10-CM | POA: Diagnosis present

## 2013-04-18 DIAGNOSIS — T84029A Dislocation of unspecified internal joint prosthesis, initial encounter: Secondary | ICD-10-CM | POA: Diagnosis not present

## 2013-04-18 DIAGNOSIS — Z79899 Other long term (current) drug therapy: Secondary | ICD-10-CM | POA: Diagnosis not present

## 2013-04-18 DIAGNOSIS — Z883 Allergy status to other anti-infective agents status: Secondary | ICD-10-CM | POA: Diagnosis not present

## 2013-04-18 DIAGNOSIS — Z471 Aftercare following joint replacement surgery: Secondary | ICD-10-CM | POA: Diagnosis not present

## 2013-04-18 DIAGNOSIS — M545 Low back pain: Secondary | ICD-10-CM | POA: Diagnosis present

## 2013-04-18 DIAGNOSIS — Y92009 Unspecified place in unspecified non-institutional (private) residence as the place of occurrence of the external cause: Secondary | ICD-10-CM | POA: Diagnosis not present

## 2013-04-18 DIAGNOSIS — F411 Generalized anxiety disorder: Secondary | ICD-10-CM | POA: Diagnosis present

## 2013-04-18 DIAGNOSIS — K5900 Constipation, unspecified: Secondary | ICD-10-CM | POA: Diagnosis not present

## 2013-04-18 DIAGNOSIS — Z96649 Presence of unspecified artificial hip joint: Secondary | ICD-10-CM | POA: Diagnosis not present

## 2013-04-18 DIAGNOSIS — G8929 Other chronic pain: Secondary | ICD-10-CM | POA: Diagnosis present

## 2013-04-18 DIAGNOSIS — Z5189 Encounter for other specified aftercare: Secondary | ICD-10-CM | POA: Diagnosis not present

## 2013-04-18 DIAGNOSIS — M25559 Pain in unspecified hip: Secondary | ICD-10-CM | POA: Diagnosis not present

## 2013-04-18 DIAGNOSIS — F17200 Nicotine dependence, unspecified, uncomplicated: Secondary | ICD-10-CM | POA: Diagnosis present

## 2013-04-18 DIAGNOSIS — Y999 Unspecified external cause status: Secondary | ICD-10-CM | POA: Diagnosis not present

## 2013-04-18 DIAGNOSIS — I10 Essential (primary) hypertension: Secondary | ICD-10-CM | POA: Diagnosis present

## 2013-04-18 DIAGNOSIS — E039 Hypothyroidism, unspecified: Secondary | ICD-10-CM | POA: Diagnosis present

## 2013-04-18 DIAGNOSIS — Z885 Allergy status to narcotic agent status: Secondary | ICD-10-CM | POA: Diagnosis not present

## 2013-04-20 NOTE — ED Notes (Signed)
Patient Advanced Directive not reviewed by Ree Shay on 04/20/2013--checking to see when Advanced Directive was reviewed for a Medical Records Audit.  JMMc

## 2013-05-07 DIAGNOSIS — K219 Gastro-esophageal reflux disease without esophagitis: Secondary | ICD-10-CM | POA: Diagnosis not present

## 2013-05-07 DIAGNOSIS — R279 Unspecified lack of coordination: Secondary | ICD-10-CM | POA: Diagnosis not present

## 2013-05-07 DIAGNOSIS — Z91018 Allergy to other foods: Secondary | ICD-10-CM | POA: Diagnosis not present

## 2013-05-07 DIAGNOSIS — Z883 Allergy status to other anti-infective agents status: Secondary | ICD-10-CM | POA: Diagnosis not present

## 2013-05-07 DIAGNOSIS — F17200 Nicotine dependence, unspecified, uncomplicated: Secondary | ICD-10-CM | POA: Diagnosis present

## 2013-05-07 DIAGNOSIS — S73006A Unspecified dislocation of unspecified hip, initial encounter: Secondary | ICD-10-CM | POA: Diagnosis not present

## 2013-05-07 DIAGNOSIS — Z885 Allergy status to narcotic agent status: Secondary | ICD-10-CM | POA: Diagnosis not present

## 2013-05-07 DIAGNOSIS — R52 Pain, unspecified: Secondary | ICD-10-CM | POA: Diagnosis not present

## 2013-05-07 DIAGNOSIS — Z780 Asymptomatic menopausal state: Secondary | ICD-10-CM | POA: Diagnosis not present

## 2013-05-07 DIAGNOSIS — Z888 Allergy status to other drugs, medicaments and biological substances status: Secondary | ICD-10-CM | POA: Diagnosis not present

## 2013-05-07 DIAGNOSIS — Z96649 Presence of unspecified artificial hip joint: Secondary | ICD-10-CM | POA: Diagnosis not present

## 2013-05-07 DIAGNOSIS — K449 Diaphragmatic hernia without obstruction or gangrene: Secondary | ICD-10-CM | POA: Diagnosis present

## 2013-05-07 DIAGNOSIS — E039 Hypothyroidism, unspecified: Secondary | ICD-10-CM | POA: Diagnosis present

## 2013-05-07 DIAGNOSIS — Z800 Family history of malignant neoplasm of digestive organs: Secondary | ICD-10-CM | POA: Diagnosis not present

## 2013-05-07 DIAGNOSIS — R32 Unspecified urinary incontinence: Secondary | ICD-10-CM | POA: Diagnosis not present

## 2013-05-07 DIAGNOSIS — R293 Abnormal posture: Secondary | ICD-10-CM | POA: Diagnosis not present

## 2013-05-07 DIAGNOSIS — K5900 Constipation, unspecified: Secondary | ICD-10-CM | POA: Diagnosis not present

## 2013-05-07 DIAGNOSIS — M25559 Pain in unspecified hip: Secondary | ICD-10-CM | POA: Diagnosis not present

## 2013-05-07 DIAGNOSIS — R269 Unspecified abnormalities of gait and mobility: Secondary | ICD-10-CM | POA: Diagnosis not present

## 2013-05-07 DIAGNOSIS — E119 Type 2 diabetes mellitus without complications: Secondary | ICD-10-CM | POA: Diagnosis not present

## 2013-05-07 DIAGNOSIS — S73016A Posterior dislocation of unspecified hip, initial encounter: Secondary | ICD-10-CM | POA: Diagnosis not present

## 2013-05-07 DIAGNOSIS — X58XXXA Exposure to other specified factors, initial encounter: Secondary | ICD-10-CM | POA: Diagnosis not present

## 2013-05-07 DIAGNOSIS — M545 Low back pain: Secondary | ICD-10-CM | POA: Diagnosis present

## 2013-05-07 DIAGNOSIS — M62838 Other muscle spasm: Secondary | ICD-10-CM | POA: Diagnosis not present

## 2013-05-07 DIAGNOSIS — G8929 Other chronic pain: Secondary | ICD-10-CM | POA: Diagnosis present

## 2013-05-07 DIAGNOSIS — Y999 Unspecified external cause status: Secondary | ICD-10-CM | POA: Diagnosis not present

## 2013-05-07 DIAGNOSIS — T84029A Dislocation of unspecified internal joint prosthesis, initial encounter: Secondary | ICD-10-CM | POA: Diagnosis not present

## 2013-05-07 DIAGNOSIS — E785 Hyperlipidemia, unspecified: Secondary | ICD-10-CM | POA: Diagnosis present

## 2013-05-07 DIAGNOSIS — Z79899 Other long term (current) drug therapy: Secondary | ICD-10-CM | POA: Diagnosis not present

## 2013-05-07 DIAGNOSIS — Y92009 Unspecified place in unspecified non-institutional (private) residence as the place of occurrence of the external cause: Secondary | ICD-10-CM | POA: Diagnosis not present

## 2013-05-07 DIAGNOSIS — F411 Generalized anxiety disorder: Secondary | ICD-10-CM | POA: Diagnosis not present

## 2013-05-07 DIAGNOSIS — I10 Essential (primary) hypertension: Secondary | ICD-10-CM | POA: Diagnosis present

## 2013-05-07 DIAGNOSIS — M6281 Muscle weakness (generalized): Secondary | ICD-10-CM | POA: Diagnosis not present

## 2013-05-07 DIAGNOSIS — L98499 Non-pressure chronic ulcer of skin of other sites with unspecified severity: Secondary | ICD-10-CM | POA: Diagnosis not present

## 2013-05-07 DIAGNOSIS — Z4789 Encounter for other orthopedic aftercare: Secondary | ICD-10-CM | POA: Diagnosis not present

## 2013-05-07 DIAGNOSIS — Z5189 Encounter for other specified aftercare: Secondary | ICD-10-CM | POA: Diagnosis not present

## 2013-05-09 DIAGNOSIS — T84099A Other mechanical complication of unspecified internal joint prosthesis, initial encounter: Secondary | ICD-10-CM | POA: Diagnosis not present

## 2013-05-09 DIAGNOSIS — Z6832 Body mass index (BMI) 32.0-32.9, adult: Secondary | ICD-10-CM | POA: Diagnosis not present

## 2013-05-09 DIAGNOSIS — Z5189 Encounter for other specified aftercare: Secondary | ICD-10-CM | POA: Diagnosis not present

## 2013-05-09 DIAGNOSIS — Z780 Asymptomatic menopausal state: Secondary | ICD-10-CM | POA: Diagnosis not present

## 2013-05-09 DIAGNOSIS — M169 Osteoarthritis of hip, unspecified: Secondary | ICD-10-CM | POA: Diagnosis not present

## 2013-05-09 DIAGNOSIS — M6281 Muscle weakness (generalized): Secondary | ICD-10-CM | POA: Diagnosis not present

## 2013-05-09 DIAGNOSIS — Y999 Unspecified external cause status: Secondary | ICD-10-CM | POA: Diagnosis not present

## 2013-05-09 DIAGNOSIS — I10 Essential (primary) hypertension: Secondary | ICD-10-CM | POA: Diagnosis not present

## 2013-05-09 DIAGNOSIS — E871 Hypo-osmolality and hyponatremia: Secondary | ICD-10-CM | POA: Diagnosis not present

## 2013-05-09 DIAGNOSIS — X58XXXA Exposure to other specified factors, initial encounter: Secondary | ICD-10-CM | POA: Diagnosis not present

## 2013-05-09 DIAGNOSIS — T84029A Dislocation of unspecified internal joint prosthesis, initial encounter: Secondary | ICD-10-CM | POA: Diagnosis not present

## 2013-05-09 DIAGNOSIS — L98499 Non-pressure chronic ulcer of skin of other sites with unspecified severity: Secondary | ICD-10-CM | POA: Diagnosis not present

## 2013-05-09 DIAGNOSIS — E119 Type 2 diabetes mellitus without complications: Secondary | ICD-10-CM | POA: Diagnosis not present

## 2013-05-09 DIAGNOSIS — M1610 Unilateral primary osteoarthritis, unspecified hip: Secondary | ICD-10-CM | POA: Diagnosis not present

## 2013-05-09 DIAGNOSIS — Z471 Aftercare following joint replacement surgery: Secondary | ICD-10-CM | POA: Diagnosis not present

## 2013-05-09 DIAGNOSIS — Z01818 Encounter for other preprocedural examination: Secondary | ICD-10-CM | POA: Diagnosis not present

## 2013-05-09 DIAGNOSIS — Z7401 Bed confinement status: Secondary | ICD-10-CM | POA: Diagnosis not present

## 2013-05-09 DIAGNOSIS — R293 Abnormal posture: Secondary | ICD-10-CM | POA: Diagnosis not present

## 2013-05-09 DIAGNOSIS — Z4789 Encounter for other orthopedic aftercare: Secondary | ICD-10-CM | POA: Diagnosis not present

## 2013-05-09 DIAGNOSIS — E669 Obesity, unspecified: Secondary | ICD-10-CM | POA: Diagnosis present

## 2013-05-09 DIAGNOSIS — E785 Hyperlipidemia, unspecified: Secondary | ICD-10-CM | POA: Diagnosis present

## 2013-05-09 DIAGNOSIS — K449 Diaphragmatic hernia without obstruction or gangrene: Secondary | ICD-10-CM | POA: Diagnosis not present

## 2013-05-09 DIAGNOSIS — K219 Gastro-esophageal reflux disease without esophagitis: Secondary | ICD-10-CM | POA: Diagnosis not present

## 2013-05-09 DIAGNOSIS — IMO0002 Reserved for concepts with insufficient information to code with codable children: Secondary | ICD-10-CM | POA: Diagnosis not present

## 2013-05-09 DIAGNOSIS — Z96649 Presence of unspecified artificial hip joint: Secondary | ICD-10-CM | POA: Diagnosis not present

## 2013-05-09 DIAGNOSIS — M62838 Other muscle spasm: Secondary | ICD-10-CM | POA: Diagnosis not present

## 2013-05-09 DIAGNOSIS — R279 Unspecified lack of coordination: Secondary | ICD-10-CM | POA: Diagnosis not present

## 2013-05-09 DIAGNOSIS — D62 Acute posthemorrhagic anemia: Secondary | ICD-10-CM | POA: Diagnosis not present

## 2013-05-09 DIAGNOSIS — K5900 Constipation, unspecified: Secondary | ICD-10-CM | POA: Diagnosis not present

## 2013-05-09 DIAGNOSIS — R269 Unspecified abnormalities of gait and mobility: Secondary | ICD-10-CM | POA: Diagnosis not present

## 2013-05-09 DIAGNOSIS — J4489 Other specified chronic obstructive pulmonary disease: Secondary | ICD-10-CM | POA: Diagnosis not present

## 2013-05-09 DIAGNOSIS — Y92009 Unspecified place in unspecified non-institutional (private) residence as the place of occurrence of the external cause: Secondary | ICD-10-CM | POA: Diagnosis not present

## 2013-05-09 DIAGNOSIS — M545 Low back pain: Secondary | ICD-10-CM | POA: Diagnosis not present

## 2013-05-09 DIAGNOSIS — E039 Hypothyroidism, unspecified: Secondary | ICD-10-CM | POA: Diagnosis present

## 2013-05-09 DIAGNOSIS — J449 Chronic obstructive pulmonary disease, unspecified: Secondary | ICD-10-CM | POA: Diagnosis not present

## 2013-05-09 DIAGNOSIS — F411 Generalized anxiety disorder: Secondary | ICD-10-CM | POA: Diagnosis present

## 2013-05-16 DIAGNOSIS — Z96649 Presence of unspecified artificial hip joint: Secondary | ICD-10-CM | POA: Diagnosis not present

## 2013-05-16 DIAGNOSIS — T84099A Other mechanical complication of unspecified internal joint prosthesis, initial encounter: Secondary | ICD-10-CM | POA: Diagnosis not present

## 2013-05-16 DIAGNOSIS — I10 Essential (primary) hypertension: Secondary | ICD-10-CM | POA: Diagnosis not present

## 2013-05-17 ENCOUNTER — Encounter (HOSPITAL_COMMUNITY): Payer: Self-pay | Admitting: *Deleted

## 2013-05-17 NOTE — Progress Notes (Signed)
Surgery scheduled for 05/22/13.  Need orders in EPIC.  Thank You.  

## 2013-05-17 NOTE — H&P (Signed)
TOTAL HIP REVISION ADMISSION H&P  Patient is admitted for right revision total hip arthroplasty.  Subjective:  Chief Complaint: Right hip pain and multiple dislocations   HPI: Mary Moody, 72 y.o. female, has a history of pain and functional disability in the right hip due to 6 dislocations of her total hip arthroplasty and patient has failed non-surgical conservative treatments for greater than 12 weeks to include NSAID's and/or analgesics, use of assistive devices and activity modification. The indications for the revision total hip arthroplasty are multiple dislocations and possible malpositioned components.  Onset of symptoms was abrupt starting <1 year ago with rapidlly worsening course since that time.  Prior procedures on the right hip include arthroplasty.  Patient currently rates pain in the right hip at 10 out of 10 with activity.  There is night pain, worsening of pain with activity and weight bearing, trendelenberg gait, pain that interfers with activities of daily living, pain with passive range of motion and when it dislocates. Patient has evidence of possible malpositioned components by imaging studies.  This condition presents safety issues increasing the risk of falls.  There is no current active signs of infection.  Risks, benefits and expectations were discussed with the patient. Patient understand the risks, benefits and expectations and wishes to proceed with surgery.   D/C Plans:   SNF - plans to return to [facility]  Post-op Meds:    No Rx given  Tranexamic Acid:   Not to be given - previous TIA  Decadron:    To be given  FYI:    Previously on Lovenox, but stopped on Sunday (05/13/2013)  Past Medical History  Diagnosis Date  . COPD (chronic obstructive pulmonary disease)   . Hypertension   . Thyroid disease     Past Surgical History  Procedure Laterality Date  . Abdominal hysterectomy    . Cholecystectomy    . Ankle fracture surgery    . Back  surgery      No prescriptions prior to admission   Allergies  Allergen Reactions  . Antivert (Meclizine Hcl)   . Codeine   . Erythromycin     History  Substance Use Topics  . Smoking status: Former Games developer  . Smokeless tobacco: Not on file  . Alcohol Use: No     Review of Systems  Constitutional: Negative.   HENT: Negative.   Eyes: Negative.   Respiratory: Positive for cough.   Cardiovascular: Negative.   Gastrointestinal: Negative.   Genitourinary: Negative.   Musculoskeletal: Positive for joint pain.  Skin: Negative.   Neurological: Negative.   Endo/Heme/Allergies: Negative.   Psychiatric/Behavioral: Negative.     Objective:  Physical Exam  Constitutional: She is oriented to person, place, and time. She appears well-developed and well-nourished.  HENT:  Head: Normocephalic and atraumatic.  Mouth/Throat: Oropharynx is clear and moist.  Eyes: Pupils are equal, round, and reactive to light.  Neck: Neck supple. No JVD present. No tracheal deviation present. No thyromegaly present.  Cardiovascular: Normal rate, regular rhythm, normal heart sounds and intact distal pulses.   Respiratory: Effort normal and breath sounds normal. No stridor. No respiratory distress. She has no wheezes.  GI: Soft. There is no tenderness. There is no guarding.  Musculoskeletal:       Right hip: She exhibits decreased range of motion, decreased strength, tenderness, bony tenderness and laceration. She exhibits no crepitus and no deformity.  Lymphadenopathy:    She has no cervical adenopathy.  Neurological: She is alert and  oriented to person, place, and time.  Skin: Skin is warm and dry.  Psychiatric: She has a normal mood and affect.    Labs:  Estimated body mass index is 32.37 kg/(m^2) as calculated from the following:   Height as of 04/16/13: 5\' 2"  (1.575 m).   Weight as of 04/16/13: 80.287 kg (177 lb).  Imaging Review:  Plain radiographs demonstrate previous total hip arthroplasty  of the right hip(s). The bone quality appears to be fair for age and reported activity level.  Assessment/Plan:  End stage arthritis, right hip(s) with failed previous arthroplasty.  The patient history, physical examination, clinical judgement of the provider and imaging studies are consistent with end stage degenerative joint disease of the right hip(s), previous total hip arthroplasty. Revision total hip arthroplasty is deemed medically necessary. The treatment options including medical management, injection therapy, arthroscopy and arthroplasty were discussed at length. The risks and benefits of total hip arthroplasty were presented and reviewed. The risks due to aseptic loosening, infection, stiffness, dislocation/subluxation,  thromboembolic complications and other imponderables were discussed.  The patient acknowledged the explanation, agreed to proceed with the plan and consent was signed. Patient is being admitted for inpatient treatment for surgery, pain control, PT, OT, prophylactic antibiotics, VTE prophylaxis, progressive ambulation and ADL's and discharge planning. The patient is planning to be discharged to skilled nursing facility.    Anastasio Auerbach Liahm Grivas   PAC  05/17/2013, 11:00 AM

## 2013-05-18 ENCOUNTER — Encounter (HOSPITAL_COMMUNITY): Payer: Self-pay | Admitting: Pharmacy Technician

## 2013-05-21 NOTE — Progress Notes (Signed)
[facility] and spoke with       .  They were informed that Mary Moody's surgery time was changed  From 1335 to 1630 on 05/22/13. Patient is to arrive at 1330 for surgery.Clear liquids until 1030 then NPO. Spoke with Gita Kudo.

## 2013-05-22 ENCOUNTER — Inpatient Hospital Stay (HOSPITAL_COMMUNITY)
Admission: RE | Admit: 2013-05-22 | Discharge: 2013-05-25 | DRG: 467 | Disposition: A | Discharge status: Skilled Nursing Facility | Payer: Medicare Other | Source: Ambulatory Visit | Attending: Orthopedic Surgery | Admitting: Orthopedic Surgery

## 2013-05-22 ENCOUNTER — Encounter (HOSPITAL_COMMUNITY): Payer: Self-pay | Admitting: Certified Registered Nurse Anesthetist

## 2013-05-22 ENCOUNTER — Inpatient Hospital Stay (HOSPITAL_COMMUNITY): Payer: Medicare Other

## 2013-05-22 ENCOUNTER — Encounter (HOSPITAL_COMMUNITY): Payer: Self-pay | Admitting: Registered Nurse

## 2013-05-22 ENCOUNTER — Encounter (HOSPITAL_COMMUNITY)
Admission: RE | Disposition: A | Discharge status: Skilled Nursing Facility | Payer: Self-pay | Source: Ambulatory Visit | Attending: Orthopedic Surgery

## 2013-05-22 ENCOUNTER — Inpatient Hospital Stay (HOSPITAL_COMMUNITY): Payer: Medicare Other | Admitting: Certified Registered Nurse Anesthetist

## 2013-05-22 DIAGNOSIS — R269 Unspecified abnormalities of gait and mobility: Secondary | ICD-10-CM | POA: Diagnosis not present

## 2013-05-22 DIAGNOSIS — K219 Gastro-esophageal reflux disease without esophagitis: Secondary | ICD-10-CM | POA: Diagnosis not present

## 2013-05-22 DIAGNOSIS — I10 Essential (primary) hypertension: Secondary | ICD-10-CM | POA: Diagnosis present

## 2013-05-22 DIAGNOSIS — T84029A Dislocation of unspecified internal joint prosthesis, initial encounter: Secondary | ICD-10-CM | POA: Diagnosis not present

## 2013-05-22 DIAGNOSIS — M545 Low back pain, unspecified: Secondary | ICD-10-CM | POA: Diagnosis not present

## 2013-05-22 DIAGNOSIS — Z7401 Bed confinement status: Secondary | ICD-10-CM | POA: Diagnosis not present

## 2013-05-22 DIAGNOSIS — E119 Type 2 diabetes mellitus without complications: Secondary | ICD-10-CM | POA: Diagnosis not present

## 2013-05-22 DIAGNOSIS — E669 Obesity, unspecified: Secondary | ICD-10-CM | POA: Diagnosis present

## 2013-05-22 DIAGNOSIS — E871 Hypo-osmolality and hyponatremia: Secondary | ICD-10-CM | POA: Diagnosis not present

## 2013-05-22 DIAGNOSIS — J4489 Other specified chronic obstructive pulmonary disease: Secondary | ICD-10-CM | POA: Diagnosis not present

## 2013-05-22 DIAGNOSIS — Z96649 Presence of unspecified artificial hip joint: Secondary | ICD-10-CM

## 2013-05-22 DIAGNOSIS — E785 Hyperlipidemia, unspecified: Secondary | ICD-10-CM | POA: Diagnosis present

## 2013-05-22 DIAGNOSIS — Z01818 Encounter for other preprocedural examination: Secondary | ICD-10-CM | POA: Diagnosis not present

## 2013-05-22 DIAGNOSIS — F411 Generalized anxiety disorder: Secondary | ICD-10-CM | POA: Diagnosis present

## 2013-05-22 DIAGNOSIS — M6281 Muscle weakness (generalized): Secondary | ICD-10-CM | POA: Diagnosis not present

## 2013-05-22 DIAGNOSIS — M1610 Unilateral primary osteoarthritis, unspecified hip: Secondary | ICD-10-CM | POA: Diagnosis present

## 2013-05-22 DIAGNOSIS — J449 Chronic obstructive pulmonary disease, unspecified: Secondary | ICD-10-CM | POA: Diagnosis present

## 2013-05-22 DIAGNOSIS — R293 Abnormal posture: Secondary | ICD-10-CM | POA: Diagnosis not present

## 2013-05-22 DIAGNOSIS — S79919A Unspecified injury of unspecified hip, initial encounter: Secondary | ICD-10-CM | POA: Diagnosis not present

## 2013-05-22 DIAGNOSIS — Z6832 Body mass index (BMI) 32.0-32.9, adult: Secondary | ICD-10-CM

## 2013-05-22 DIAGNOSIS — R279 Unspecified lack of coordination: Secondary | ICD-10-CM | POA: Diagnosis not present

## 2013-05-22 DIAGNOSIS — D62 Acute posthemorrhagic anemia: Secondary | ICD-10-CM | POA: Diagnosis not present

## 2013-05-22 DIAGNOSIS — M169 Osteoarthritis of hip, unspecified: Secondary | ICD-10-CM | POA: Diagnosis not present

## 2013-05-22 DIAGNOSIS — D500 Iron deficiency anemia secondary to blood loss (chronic): Secondary | ICD-10-CM | POA: Diagnosis not present

## 2013-05-22 DIAGNOSIS — E039 Hypothyroidism, unspecified: Secondary | ICD-10-CM | POA: Diagnosis present

## 2013-05-22 DIAGNOSIS — Z471 Aftercare following joint replacement surgery: Secondary | ICD-10-CM | POA: Diagnosis not present

## 2013-05-22 DIAGNOSIS — Z5189 Encounter for other specified aftercare: Secondary | ICD-10-CM | POA: Diagnosis not present

## 2013-05-22 DIAGNOSIS — IMO0002 Reserved for concepts with insufficient information to code with codable children: Secondary | ICD-10-CM | POA: Diagnosis not present

## 2013-05-22 DIAGNOSIS — M25559 Pain in unspecified hip: Secondary | ICD-10-CM | POA: Diagnosis not present

## 2013-05-22 DIAGNOSIS — Y831 Surgical operation with implant of artificial internal device as the cause of abnormal reaction of the patient, or of later complication, without mention of misadventure at the time of the procedure: Secondary | ICD-10-CM | POA: Diagnosis present

## 2013-05-22 HISTORY — DX: Anxiety disorder, unspecified: F41.9

## 2013-05-22 HISTORY — DX: Hypothyroidism, unspecified: E03.9

## 2013-05-22 HISTORY — DX: Unspecified dislocation of right hip, initial encounter: S73.004A

## 2013-05-22 HISTORY — PX: TOTAL HIP REVISION: SHX763

## 2013-05-22 HISTORY — DX: Other chronic pain: G89.29

## 2013-05-22 HISTORY — DX: Personal history of other diseases of the digestive system: Z87.19

## 2013-05-22 HISTORY — DX: Low back pain: M54.5

## 2013-05-22 HISTORY — DX: Low back pain, unspecified: M54.50

## 2013-05-22 HISTORY — DX: Type 2 diabetes mellitus without complications: E11.9

## 2013-05-22 HISTORY — DX: Hyperlipidemia, unspecified: E78.5

## 2013-05-22 HISTORY — DX: Unspecified urinary incontinence: R32

## 2013-05-22 LAB — ABO/RH: ABO/RH(D): O NEG

## 2013-05-22 LAB — CBC
HCT: 37.5 % (ref 36.0–46.0)
MCH: 28.5 pg (ref 26.0–34.0)
MCHC: 33.3 g/dL (ref 30.0–36.0)
MCV: 85.6 fL (ref 78.0–100.0)
Platelets: 350 10*3/uL (ref 150–400)
RDW: 14.7 % (ref 11.5–15.5)
WBC: 7.4 10*3/uL (ref 4.0–10.5)

## 2013-05-22 LAB — URINALYSIS, ROUTINE W REFLEX MICROSCOPIC
Glucose, UA: NEGATIVE mg/dL
Hgb urine dipstick: NEGATIVE
Ketones, ur: NEGATIVE mg/dL
Leukocytes, UA: NEGATIVE
Protein, ur: NEGATIVE mg/dL
pH: 7.5 (ref 5.0–8.0)

## 2013-05-22 LAB — GLUCOSE, CAPILLARY
Glucose-Capillary: 92 mg/dL (ref 70–99)
Glucose-Capillary: 79 mg/dL (ref 70–99)
Glucose-Capillary: 156 mg/dL — ABNORMAL HIGH (ref 70–99)

## 2013-05-22 LAB — BASIC METABOLIC PANEL
BUN: 12 mg/dL (ref 6–23)
Calcium: 11.3 mg/dL — ABNORMAL HIGH (ref 8.4–10.5)
Creatinine, Ser: 0.76 mg/dL (ref 0.50–1.10)
GFR calc Af Amer: >90 mL/min (ref >90)
GFR calc non Af Amer: 83 mL/min — ABNORMAL LOW (ref >90)

## 2013-05-22 LAB — SURGICAL PCR SCREEN: MRSA, PCR: NEGATIVE

## 2013-05-22 LAB — POCT I-STAT 4, (NA,K, GLUC, HGB,HCT)
Hemoglobin: 12.2 g/dL (ref 12.0–15.0)
Potassium: 4.4 mEq/L (ref 3.5–5.1)

## 2013-05-22 LAB — PROTIME-INR: Prothrombin Time: 14.7 seconds (ref 11.6–15.2)

## 2013-05-22 SURGERY — TOTAL HIP REVISION
Anesthesia: Spinal | Site: Hip | Laterality: Right | Wound class: Clean

## 2013-05-22 MED ORDER — MEPERIDINE HCL 50 MG/ML IJ SOLN: 6.2500 mg | INTRAMUSCULAR | Status: DC | PRN

## 2013-05-22 MED ORDER — HYDROMORPHONE HCL PF 1 MG/ML IJ SOLN: 0.2500 mg | INTRAMUSCULAR | Status: DC | PRN

## 2013-05-22 MED ORDER — PROMETHAZINE HCL 25 MG/ML IJ SOLN: 6.2500 mg | INTRAMUSCULAR | Status: DC | PRN

## 2013-05-22 MED ORDER — ACETAMINOPHEN 10 MG/ML IV SOLN
INTRAVENOUS | Status: DC | PRN
  Administered 2013-05-22: 1000 mg via INTRAVENOUS

## 2013-05-22 MED ORDER — ACETAMINOPHEN 650 MG RE SUPP: 650.0000 mg | Freq: Four times a day (QID) | RECTAL | Status: DC | PRN

## 2013-05-22 MED ORDER — LISINOPRIL 10 MG PO TABS
10.0000 mg | ORAL_TABLET | Freq: Every day | ORAL | Status: DC
  Administered 2013-05-23 – 2013-05-25 (×3): 10 mg via ORAL
  Filled 2013-05-22 (×3): qty 1

## 2013-05-22 MED ORDER — LABETALOL HCL 200 MG PO TABS
200.0000 mg | ORAL_TABLET | Freq: Two times a day (BID) | ORAL | Status: DC
  Administered 2013-05-22 – 2013-05-25 (×4): 200 mg via ORAL
  Filled 2013-05-22 (×7): qty 1

## 2013-05-22 MED ORDER — ADULT MULTIVITAMIN W/MINERALS CH
1.0000 | ORAL_TABLET | Freq: Every day | ORAL | Status: DC
  Administered 2013-05-23 – 2013-05-25 (×3): 1 via ORAL
  Filled 2013-05-22 (×3): qty 1

## 2013-05-22 MED ORDER — BUPIVACAINE HCL (PF) 0.5 % IJ SOLN
INTRAMUSCULAR | Status: DC | PRN
  Administered 2013-05-22: 3 mL

## 2013-05-22 MED ORDER — FENTANYL CITRATE 0.05 MG/ML IJ SOLN
INTRAMUSCULAR | Status: DC | PRN
  Administered 2013-05-22: 50 ug via INTRAVENOUS

## 2013-05-22 MED ORDER — ONDANSETRON HCL 4 MG/2ML IJ SOLN: 4.0000 mg | Freq: Four times a day (QID) | INTRAMUSCULAR | Status: DC | PRN

## 2013-05-22 MED ORDER — ENOXAPARIN SODIUM 120 MG/0.8ML ~~LOC~~ SOLN
120.0000 mg | SUBCUTANEOUS | Status: DC
  Filled 2013-05-22: qty 0.8

## 2013-05-22 MED ORDER — AMLODIPINE BESYLATE 10 MG PO TABS
10.0000 mg | ORAL_TABLET | Freq: Every morning | ORAL | Status: DC
  Administered 2013-05-23 – 2013-05-25 (×3): 10 mg via ORAL
  Filled 2013-05-22 (×3): qty 1

## 2013-05-22 MED ORDER — GEMFIBROZIL 600 MG PO TABS
600.0000 mg | ORAL_TABLET | Freq: Two times a day (BID) | ORAL | Status: DC
  Administered 2013-05-23 – 2013-05-25 (×5): 600 mg via ORAL
  Filled 2013-05-22 (×7): qty 1

## 2013-05-22 MED ORDER — PANTOPRAZOLE SODIUM 40 MG PO TBEC
40.0000 mg | DELAYED_RELEASE_TABLET | Freq: Every day | ORAL | Status: DC
  Administered 2013-05-23 – 2013-05-25 (×3): 40 mg via ORAL
  Filled 2013-05-22 (×3): qty 1

## 2013-05-22 MED ORDER — LACTATED RINGERS IV SOLN
INTRAVENOUS | Status: DC
  Administered 2013-05-22: 21:00:00 via INTRAVENOUS

## 2013-05-22 MED ORDER — 0.9 % SODIUM CHLORIDE (POUR BTL) OPTIME
TOPICAL | Status: DC | PRN
  Administered 2013-05-22: 1000 mL

## 2013-05-22 MED ORDER — PHENOL 1.4 % MT LIQD
1.0000 | OROMUCOSAL | Status: DC | PRN
  Filled 2013-05-22: qty 177

## 2013-05-22 MED ORDER — SODIUM CHLORIDE 0.9 % IV SOLN: Freq: Once | INTRAVENOUS | Status: DC

## 2013-05-22 MED ORDER — METOCLOPRAMIDE HCL 10 MG PO TABS: 5.0000 mg | ORAL_TABLET | Freq: Three times a day (TID) | ORAL | Status: DC | PRN

## 2013-05-22 MED ORDER — ONDANSETRON HCL 4 MG/2ML IJ SOLN
INTRAMUSCULAR | Status: DC | PRN
  Administered 2013-05-22: 4 mg via INTRAVENOUS

## 2013-05-22 MED ORDER — CEFAZOLIN SODIUM-DEXTROSE 2-3 GM-% IV SOLR
2.0000 g | INTRAVENOUS | Status: AC
  Administered 2013-05-22: 2 g via INTRAVENOUS

## 2013-05-22 MED ORDER — DARIFENACIN HYDROBROMIDE ER 15 MG PO TB24
15.0000 mg | ORAL_TABLET | Freq: Every day | ORAL | Status: DC
  Administered 2013-05-23 – 2013-05-25 (×3): 15 mg via ORAL
  Filled 2013-05-22 (×3): qty 1

## 2013-05-22 MED ORDER — ASPIRIN EC 325 MG PO TBEC: 325.0000 mg | DELAYED_RELEASE_TABLET | Freq: Two times a day (BID) | ORAL | Status: DC

## 2013-05-22 MED ORDER — ACETAMINOPHEN 325 MG PO TABS: 650.0000 mg | ORAL_TABLET | Freq: Four times a day (QID) | ORAL | Status: DC | PRN

## 2013-05-22 MED ORDER — ONDANSETRON HCL 4 MG PO TABS: 4.0000 mg | ORAL_TABLET | Freq: Four times a day (QID) | ORAL | Status: DC | PRN

## 2013-05-22 MED ORDER — METOCLOPRAMIDE HCL 5 MG/ML IJ SOLN: 5.0000 mg | Freq: Three times a day (TID) | INTRAMUSCULAR | Status: DC | PRN

## 2013-05-22 MED ORDER — CHLORHEXIDINE GLUCONATE 4 % EX LIQD
60.0000 mL | Freq: Once | CUTANEOUS | Status: DC
  Filled 2013-05-22: qty 60

## 2013-05-22 MED ORDER — HYDROMORPHONE HCL PF 1 MG/ML IJ SOLN
0.5000 mg | INTRAMUSCULAR | Status: DC | PRN
  Administered 2013-05-22: 0.5 mg via INTRAVENOUS
  Administered 2013-05-23 (×2): 1 mg via INTRAVENOUS
  Filled 2013-05-22 (×3): qty 1

## 2013-05-22 MED ORDER — POLYETHYLENE GLYCOL 3350 17 G PO PACK
17.0000 g | PACK | Freq: Every day | ORAL | Status: DC
  Administered 2013-05-22 – 2013-05-25 (×4): 17 g via ORAL

## 2013-05-22 MED ORDER — BUSPIRONE HCL 15 MG PO TABS
15.0000 mg | ORAL_TABLET | Freq: Two times a day (BID) | ORAL | Status: DC
  Administered 2013-05-22 – 2013-05-25 (×6): 15 mg via ORAL
  Filled 2013-05-22 (×7): qty 1

## 2013-05-22 MED ORDER — MIDAZOLAM HCL 5 MG/5ML IJ SOLN
INTRAMUSCULAR | Status: DC | PRN
  Administered 2013-05-22: 2 mg via INTRAVENOUS

## 2013-05-22 MED ORDER — KETAMINE HCL 10 MG/ML IJ SOLN
INTRAMUSCULAR | Status: DC | PRN
  Administered 2013-05-22 (×2): 20 mg via INTRAVENOUS
  Administered 2013-05-22: 10 mg via INTRAVENOUS
  Administered 2013-05-22: 20 mg via INTRAVENOUS

## 2013-05-22 MED ORDER — MENTHOL 3 MG MT LOZG
1.0000 | LOZENGE | OROMUCOSAL | Status: DC | PRN
  Filled 2013-05-22: qty 9

## 2013-05-22 MED ORDER — PHENYLEPHRINE HCL 10 MG/ML IJ SOLN
10.0000 mg | INTRAVENOUS | Status: DC | PRN
  Administered 2013-05-22: 25 ug/min via INTRAVENOUS

## 2013-05-22 MED ORDER — GLIMEPIRIDE 2 MG PO TABS
2.0000 mg | ORAL_TABLET | Freq: Every day | ORAL | Status: DC
  Administered 2013-05-23 – 2013-05-25 (×3): 2 mg via ORAL
  Filled 2013-05-22 (×5): qty 1

## 2013-05-22 MED ORDER — SODIUM CHLORIDE 0.9 % IV SOLN
INTRAVENOUS | Status: DC
  Filled 2013-05-22 (×7): qty 1000

## 2013-05-22 MED ORDER — CYCLOBENZAPRINE HCL 10 MG PO TABS
10.0000 mg | ORAL_TABLET | Freq: Three times a day (TID) | ORAL | Status: DC | PRN
  Administered 2013-05-22 – 2013-05-24 (×4): 10 mg via ORAL
  Filled 2013-05-22 (×4): qty 1

## 2013-05-22 MED ORDER — DEXAMETHASONE SODIUM PHOSPHATE 10 MG/ML IJ SOLN
10.0000 mg | Freq: Once | INTRAMUSCULAR | Status: AC
  Administered 2013-05-22: 10 mg via INTRAVENOUS

## 2013-05-22 MED ORDER — CALCIUM CARBONATE-VITAMIN D 500-200 MG-UNIT PO TABS
1.0000 | ORAL_TABLET | Freq: Three times a day (TID) | ORAL | Status: DC
  Administered 2013-05-23 – 2013-05-25 (×7): 1 via ORAL
  Filled 2013-05-22 (×9): qty 1

## 2013-05-22 MED ORDER — CEFAZOLIN SODIUM 1-5 GM-% IV SOLN
1.0000 g | Freq: Four times a day (QID) | INTRAVENOUS | Status: AC
  Administered 2013-05-22 – 2013-05-23 (×2): 1 g via INTRAVENOUS
  Filled 2013-05-22 (×2): qty 50

## 2013-05-22 MED ORDER — DOCUSATE SODIUM 100 MG PO CAPS
100.0000 mg | ORAL_CAPSULE | Freq: Two times a day (BID) | ORAL | Status: DC
  Administered 2013-05-22 – 2013-05-25 (×6): 100 mg via ORAL

## 2013-05-22 MED ORDER — HYDROCODONE-ACETAMINOPHEN 7.5-325 MG PO TABS
1.0000 | ORAL_TABLET | Freq: Four times a day (QID) | ORAL | Status: DC
  Administered 2013-05-22: 1 via ORAL
  Administered 2013-05-23 (×3): 2 via ORAL
  Administered 2013-05-23: 1 via ORAL
  Administered 2013-05-24 – 2013-05-25 (×6): 2 via ORAL
  Filled 2013-05-22: qty 2
  Filled 2013-05-22 (×2): qty 1
  Filled 2013-05-22 (×8): qty 2

## 2013-05-22 MED ORDER — LACTATED RINGERS IV SOLN
INTRAVENOUS | Status: DC
  Administered 2013-05-22: 1000 mL via INTRAVENOUS
  Administered 2013-05-22 (×3): via INTRAVENOUS

## 2013-05-22 MED ORDER — VITAMIN D-3 25 MCG (1000 UT) PO CAPS
1000.0000 [IU] | ORAL_CAPSULE | Freq: Every day | ORAL | Status: DC
  Filled 2013-05-22: qty 1

## 2013-05-22 MED ORDER — LACTATED RINGERS IV SOLN
INTRAVENOUS | Status: AC
  Administered 2013-05-22: 23:00:00 via INTRAVENOUS

## 2013-05-22 MED ORDER — PROPOFOL INFUSION 10 MG/ML OPTIME
INTRAVENOUS | Status: DC | PRN
  Administered 2013-05-22: 75 ug/kg/min via INTRAVENOUS

## 2013-05-22 MED ORDER — ONE-DAILY MULTI VITAMINS PO TABS: 1.0000 | ORAL_TABLET | Freq: Every day | ORAL | Status: DC

## 2013-05-22 MED ORDER — LACTATED RINGERS IV BOLUS (SEPSIS)
250.0000 mL | Freq: Once | INTRAVENOUS | Status: AC
  Administered 2013-05-22: 250 mL via INTRAVENOUS

## 2013-05-22 MED ORDER — LEVOTHYROXINE SODIUM 100 MCG PO TABS
100.0000 ug | ORAL_TABLET | Freq: Every day | ORAL | Status: DC
  Administered 2013-05-23 – 2013-05-25 (×3): 100 ug via ORAL
  Filled 2013-05-22 (×4): qty 1

## 2013-05-22 SURGICAL SUPPLY — 60 items
BAG ZIPLOCK 12X15 (MISCELLANEOUS) ×2 IMPLANT
BLADE SAW SGTL 18X1.27X75 (BLADE) IMPLANT
BLADE SHORT EXPLANT 50MM (BLADE) ×4 IMPLANT
BRUSH FEMORAL CANAL (MISCELLANEOUS) IMPLANT
CLOTH BEACON ORANGE TIMEOUT ST (SAFETY) ×2 IMPLANT
CUP W-CLUSTER HOLE 52 II (Orthopedic Implant) ×2 IMPLANT
DERMABOND ADVANCED (GAUZE/BANDAGES/DRESSINGS)
DERMABOND ADVANCED .7 DNX12 (GAUZE/BANDAGES/DRESSINGS) IMPLANT
DRAPE INCISE IOBAN 85X60 (DRAPES) ×2 IMPLANT
DRAPE ORTHO SPLIT 77X108 STRL (DRAPES) ×2
DRAPE POUCH INSTRU U-SHP 10X18 (DRAPES) ×2 IMPLANT
DRAPE SURG 17X11 SM STRL (DRAPES) ×2 IMPLANT
DRAPE SURG ORHT 6 SPLT 77X108 (DRAPES) ×2 IMPLANT
DRAPE U-SHAPE 47X51 STRL (DRAPES) ×2 IMPLANT
DRSG AQUACEL AG ADV 3.5X10 (GAUZE/BANDAGES/DRESSINGS) ×2 IMPLANT
DRSG AQUACEL AG ADV 3.5X14 (GAUZE/BANDAGES/DRESSINGS) IMPLANT
DRSG EMULSION OIL 3X16 NADH (GAUZE/BANDAGES/DRESSINGS) IMPLANT
DRSG MEPILEX BORDER 4X4 (GAUZE/BANDAGES/DRESSINGS) IMPLANT
DRSG MEPILEX BORDER 4X8 (GAUZE/BANDAGES/DRESSINGS) IMPLANT
DRSG TEGADERM 4X4.75 (GAUZE/BANDAGES/DRESSINGS) IMPLANT
DURAPREP 26ML APPLICATOR (WOUND CARE) ×2 IMPLANT
ELECT BLADE TIP CTD 4 INCH (ELECTRODE) ×2 IMPLANT
ELECT REM PT RETURN 9FT ADLT (ELECTROSURGICAL) ×2
ELECTRODE REM PT RTRN 9FT ADLT (ELECTROSURGICAL) ×1 IMPLANT
EVACUATOR 1/8 PVC DRAIN (DRAIN) ×2 IMPLANT
FACESHIELD LNG OPTICON STERILE (SAFETY) ×10 IMPLANT
GAUZE SPONGE 2X2 8PLY STRL LF (GAUZE/BANDAGES/DRESSINGS) IMPLANT
GLOVE BIOGEL PI IND STRL 7.5 (GLOVE) ×1 IMPLANT
GLOVE BIOGEL PI IND STRL 8 (GLOVE) ×1 IMPLANT
GLOVE BIOGEL PI INDICATOR 7.5 (GLOVE) ×1
GLOVE BIOGEL PI INDICATOR 8 (GLOVE) ×1
GLOVE ECLIPSE 8.0 STRL XLNG CF (GLOVE) ×4 IMPLANT
GOWN BRE IMP PREV XXLGXLNG (GOWN DISPOSABLE) ×2 IMPLANT
GOWN STRL NON-REIN LRG LVL3 (GOWN DISPOSABLE) ×6 IMPLANT
HANDPIECE INTERPULSE COAX TIP (DISPOSABLE)
HEAD FEM VERSYS HIP 12/14 36MM (Liner) ×2 IMPLANT
KIT BASIN OR (CUSTOM PROCEDURE TRAY) ×2 IMPLANT
LINER CONTINUUM TRIOLOGY (Liner) ×2 IMPLANT
MANIFOLD NEPTUNE II (INSTRUMENTS) ×2 IMPLANT
NS IRRIG 1000ML POUR BTL (IV SOLUTION) ×2 IMPLANT
PACK TOTAL JOINT (CUSTOM PROCEDURE TRAY) ×2 IMPLANT
POSITIONER SURGICAL ARM (MISCELLANEOUS) ×2 IMPLANT
PRESSURIZER FEMORAL UNIV (MISCELLANEOUS) IMPLANT
SCREW BONE 6.5X25 (Screw) ×2 IMPLANT
SCREW BONE SELF TAP 6.5X40 (Screw) ×2 IMPLANT
SET HNDPC FAN SPRY TIP SCT (DISPOSABLE) IMPLANT
SPONGE GAUZE 2X2 STER 10/PKG (GAUZE/BANDAGES/DRESSINGS)
SPONGE LAP 18X18 X RAY DECT (DISPOSABLE) ×2 IMPLANT
SPONGE LAP 4X18 X RAY DECT (DISPOSABLE) IMPLANT
STAPLER VISISTAT 35W (STAPLE) IMPLANT
SUCTION FRAZIER TIP 10 FR DISP (SUCTIONS) ×2 IMPLANT
SUT MNCRL AB 4-0 PS2 18 (SUTURE) ×2 IMPLANT
SUT VIC AB 1 CT1 36 (SUTURE) ×4 IMPLANT
SUT VIC AB 2-0 CT1 27 (SUTURE) ×2
SUT VIC AB 2-0 CT1 TAPERPNT 27 (SUTURE) ×2 IMPLANT
SUT VLOC 180 0 24IN GS25 (SUTURE) ×2 IMPLANT
TOWEL OR 17X26 10 PK STRL BLUE (TOWEL DISPOSABLE) ×4 IMPLANT
TOWER CARTRIDGE SMART MIX (DISPOSABLE) IMPLANT
TRAY FOLEY CATH 14FRSI W/METER (CATHETERS) ×2 IMPLANT
WATER STERILE IRR 1500ML POUR (IV SOLUTION) ×4 IMPLANT

## 2013-05-22 NOTE — Anesthesia Procedure Notes (Signed)
Spinal  Patient location during procedure: OR Staffing Anesthesiologist: Katey Barrie Performed by: anesthesiologist  Preanesthetic Checklist Completed: patient identified, site marked, surgical consent, pre-op evaluation, timeout performed, IV checked, risks and benefits discussed and monitors and equipment checked Spinal Block Patient position: sitting Prep: Betadine Patient monitoring: heart rate, continuous pulse ox and blood pressure Approach: right paramedian Location: L2-3 Injection technique: single-shot Needle Needle type: Spinocan  Needle gauge: 22 G Needle length: 9 cm Additional Notes Expiration date of kit checked and confirmed. Patient tolerated procedure well, without complications.     

## 2013-05-22 NOTE — Interval H&P Note (Signed)
History and Physical Interval Note:  05/22/2013 3:57 PM  Mary Moody  has presented today for surgery, with the diagnosis of RIGHT TOTAL HIP ARTHROPLASTY INSTABILITY  The various methods of treatment have been discussed with the patient and family. After consideration of risks, benefits and other options for treatment, the patient has consented to  Procedure(s): REVISION RIGHT TOTAL HIP ARTHROPLASTY (Right) as a surgical intervention .  The patient's history has been reviewed, patient examined, no change in status, stable for surgery.  I have reviewed the patient's chart and labs.  Questions were answered to the patient's satisfaction.     Shelda Pal

## 2013-05-22 NOTE — Plan of Care (Signed)
Problem: Consults Goal: Diagnosis- Total Joint Replacement Revision Total Hip     

## 2013-05-22 NOTE — Brief Op Note (Signed)
05/22/2013  6:50 PM  PATIENT:  Mary Moody  72 y.o. female  PRE-OPERATIVE DIAGNOSIS:  RIGHT TOTAL HIP ARTHROPLASTY INSTABILITY, failed right hip  POST-OPERATIVE DIAGNOSIS:  Failed right total hip due to instability  PROCEDURE:  Procedure(s): REVISION RIGHT TOTAL HIP ARTHROPLASTY (Right)  SURGEON:  Surgeon(s) and Role:    * Shelda Pal, MD - Primary  PHYSICIAN ASSISTANT: Lanney Gins, PA-C  ANESTHESIA:   Spinal  EBL:  Total I/O In: 3000 [I.V.:3000] Out: 980 [Urine:230; Blood:750]  BLOOD ADMINISTERED:none  DRAINS: (1 medium) Hemovact drain(s) in the right hip with  Suction Open   LOCAL MEDICATIONS USED:  NONE  SPECIMEN:  No Specimen  DISPOSITION OF SPECIMEN:  N/A  COUNTS:  YES  TOURNIQUET:  * No tourniquets in log *  DICTATION: .Other Dictation: Dictation Number [id number]  PLAN OF CARE: Admit to inpatient   PATIENT DISPOSITION:  PACU - hemodynamically stable.   Delay start of Pharmacological VTE agent (>24hrs) due to surgical blood loss or risk of bleeding: no

## 2013-05-22 NOTE — Anesthesia Preprocedure Evaluation (Signed)
Anesthesia Evaluation  Patient identified by MRN, date of birth, ID band Patient awake    Reviewed: Allergy & Precautions, H&P , NPO status , Patient's Chart, lab work & pertinent test results  Airway Mallampati: II TM Distance: >3 FB Neck ROM: Full    Dental no notable dental hx. (+) Edentulous Upper and Edentulous Lower   Pulmonary COPDformer smoker,  breath sounds clear to auscultation  Pulmonary exam normal       Cardiovascular hypertension, Pt. on medications Rhythm:Regular Rate:Normal     Neuro/Psych negative neurological ROS  negative psych ROS   GI/Hepatic Neg liver ROS, hiatal hernia,   Endo/Other  diabetes, Type 2Hypothyroidism   Renal/GU negative Renal ROS  negative genitourinary   Musculoskeletal negative musculoskeletal ROS (+)   Abdominal   Peds negative pediatric ROS (+)  Hematology negative hematology ROS (+)   Anesthesia Other Findings   Reproductive/Obstetrics negative OB ROS                           Anesthesia Physical Anesthesia Plan  ASA: III  Anesthesia Plan: Spinal   Post-op Pain Management:    Induction: Intravenous  Airway Management Planned: Simple Face Mask  Additional Equipment:   Intra-op Plan:   Post-operative Plan:   Informed Consent: I have reviewed the patients History and Physical, chart, labs and discussed the procedure including the risks, benefits and alternatives for the proposed anesthesia with the patient or authorized representative who has indicated his/her understanding and acceptance.   Dental advisory given  Plan Discussed with: CRNA  Anesthesia Plan Comments:         Anesthesia Quick Evaluation

## 2013-05-22 NOTE — Transfer of Care (Signed)
Immediate Anesthesia Transfer of Care Note  Patient: Mary Moody  Procedure(s) Performed: Procedure(s): REVISION RIGHT TOTAL HIP ARTHROPLASTY (Right)  Patient Location: PACU  Anesthesia Type:Spinal  Level of Consciousness: awake and patient cooperative  Airway & Oxygen Therapy: Patient Spontanous Breathing and Patient connected to face mask oxygen  Post-op Assessment: Report given to PACU RN and Post -op Vital signs reviewed and stable  Post vital signs: Reviewed and stable  Complications: No apparent anesthesia complications

## 2013-05-22 NOTE — Preoperative (Signed)
Beta Blockers   Reason not to administer Beta Blockers:Took Labetolol this am at 1100.

## 2013-05-23 DIAGNOSIS — E871 Hypo-osmolality and hyponatremia: Secondary | ICD-10-CM | POA: Diagnosis not present

## 2013-05-23 DIAGNOSIS — Z96649 Presence of unspecified artificial hip joint: Secondary | ICD-10-CM

## 2013-05-23 DIAGNOSIS — E669 Obesity, unspecified: Secondary | ICD-10-CM | POA: Diagnosis present

## 2013-05-23 DIAGNOSIS — D500 Iron deficiency anemia secondary to blood loss (chronic): Secondary | ICD-10-CM | POA: Diagnosis not present

## 2013-05-23 LAB — GLUCOSE, CAPILLARY
Glucose-Capillary: 153 mg/dL — ABNORMAL HIGH (ref 70–99)
Glucose-Capillary: 157 mg/dL — ABNORMAL HIGH (ref 70–99)
Glucose-Capillary: 123 mg/dL — ABNORMAL HIGH (ref 70–99)

## 2013-05-23 LAB — URINE CULTURE: Culture: NO GROWTH

## 2013-05-23 LAB — CBC
Hemoglobin: 8.4 g/dL — ABNORMAL LOW (ref 12.0–15.0)
Platelets: 300 10*3/uL (ref 150–400)
RBC: 2.87 MIL/uL — ABNORMAL LOW (ref 3.87–5.11)
WBC: 12.2 10*3/uL — ABNORMAL HIGH (ref 4.0–10.5)

## 2013-05-23 LAB — BASIC METABOLIC PANEL
CO2: 24 mEq/L (ref 19–32)
Chloride: 96 mEq/L (ref 96–112)
Glucose, Bld: 189 mg/dL — ABNORMAL HIGH (ref 70–99)
Potassium: 4.6 mEq/L (ref 3.5–5.1)
Sodium: 130 mEq/L — ABNORMAL LOW (ref 135–145)

## 2013-05-23 MED ORDER — VITAMIN D 1000 UNITS PO TABS
1000.0000 [IU] | ORAL_TABLET | Freq: Every day | ORAL | Status: DC
  Administered 2013-05-23 – 2013-05-25 (×3): 1000 [IU] via ORAL
  Filled 2013-05-23 (×3): qty 1

## 2013-05-23 MED ORDER — RIVAROXABAN 10 MG PO TABS
10.0000 mg | ORAL_TABLET | Freq: Every day | ORAL | Status: DC
  Administered 2013-05-23 – 2013-05-25 (×3): 10 mg via ORAL
  Filled 2013-05-23 (×3): qty 1

## 2013-05-23 NOTE — Progress Notes (Signed)
Clinical Social Work Department BRIEF PSYCHOSOCIAL ASSESSMENT 05/23/2013  Patient:  Mary Moody, Mary Moody     Account Number:  [id number]     Admit date:  05/22/2013  Clinical Social Worker:  Candie Chroman  Date/Time:  05/23/2013 12:13 PM  Referred by:  Physician  Date Referred:  05/23/2013 Referred for  SNF Placement   Other Referral:   Interview type:  Patient Other interview type:    PSYCHOSOCIAL DATA Living Status:  FACILITY Admitted from facility:  [facility] Level of care:  Skilled Nursing Facility Primary support name:  Dyann Ruddle Primary support relationship to patient:  FAMILY Degree of support available:   unclear    CURRENT CONCERNS Current Concerns  Post-Acute Placement   Other Concerns:    SOCIAL WORK ASSESSMENT / PLAN Pt is a 72 yr old female admitted from [facility]. CSW met with pt to assist with d/c planning. Pt  plans to return to [facility] following hospital d/c. CSW contacted SNF and d/c plan has been confirmed. CSW will follow to assist with d/c planning back to SNF when stable.   Assessment/plan status:  Psychosocial Support/Ongoing Assessment of Needs Other assessment/ plan:   Information/referral to community resources:   None needed at this time.    PATIENT'S/FAMILY'S RESPONSE TO PLAN OF CARE: Pt is looking forward to returning to [facility] and resuming her rehab.   Cori Razor LCSW [phone]

## 2013-05-23 NOTE — Progress Notes (Signed)
Physical Therapy Treatment Patient Details Name: Mary Moody MRN: [id number] DOB: [birth year] Today's Date: 05/23/2013 Time: 4782-9562 PT Time Calculation (min): 10 min  PT Assessment / Plan / Recommendation  PT Comments   Pt continues limited by c/o dizziness with mobility.  Follow Up Recommendations  SNF     Does the patient have the potential to tolerate intense rehabilitation     Barriers to Discharge        Equipment Recommendations  None recommended by PT    Recommendations for Other Services OT consult  Frequency 7X/week   6Progress towards PT Goals Progress towards PT goals: Progressing toward goals  Plan Current plan remains appropriate    Precautions / Restrictions Precautions Precautions: Posterior Hip;Fall Precaution Comments: pt recalls all THP Restrictions Weight Bearing Restrictions: Yes RLE Weight Bearing: Partial weight bearing   Pertinent Vitals/Pain 6/10 burning pain; RN aware    Mobility  Bed Mobility Bed Mobility: Sit to Supine Sit to Supine: 3: Mod assist Details for Bed Mobility Assistance: cues for sequence and use of L LE to self assist Transfers Transfers: Sit to Stand;Stand to Sit Sit to Stand: 1: +2 Total assist;From chair/3-in-1 Sit to Stand: Patient Percentage: 80% Stand to Sit: 1: +2 Total assist;To bed Stand to Sit: Patient Percentage: 80% Details for Transfer Assistance: cues for LE management and use of UEs to self assist Ambulation/Gait Ambulation/Gait Assistance: 1: +2 Total assist Ambulation/Gait: Patient Percentage: 80% Ambulation Distance (Feet): 3 Feet Assistive device: Rolling walker Ambulation/Gait Assistance Details: cues for sequence, posture and position from RW Gait Pattern: Step-to pattern;Decreased step length - left;Trunk flexed General Gait Details: Ltd by c/o ongoing dizziness Stairs: No    Exercises     PT Diagnosis:    PT Problem List:   PT Treatment Interventions:     PT Goals (current goals can  now be found in the care plan section) Acute Rehab PT Goals Patient Stated Goal: Rehab and HOME asap PT Goal Formulation: With patient Time For Goal Achievement: 06/06/13 Potential to Achieve Goals: Good  Visit Information  Last PT Received On: 05/23/13 Assistance Needed: +2 (2* c/o dizziness)    Subjective Data  Subjective: I know why I'm dizzy.  Its because my hemaglobin is down,  This always happens.  Its going to be even lower tomorrow Patient Stated Goal: Rehab and HOME asap   Cognition  Cognition Arousal/Alertness: Awake/alert Behavior During Therapy: WFL for tasks assessed/performed Overall Cognitive Status: Within Functional Limits for tasks assessed    Balance     End of Session PT - End of Session Equipment Utilized During Treatment: Gait belt Activity Tolerance: Patient tolerated treatment well Patient left: in bed;with call bell/phone within reach Nurse Communication: Mobility status   GP     Mary Moody 05/23/2013, 2:07 PM

## 2013-05-23 NOTE — Progress Notes (Signed)
Utilization review completed.  

## 2013-05-23 NOTE — Anesthesia Postprocedure Evaluation (Addendum)
  Anesthesia Post-op Note  Patient: Mary Moody  Procedure(s) Performed: Procedure(s) (LRB): REVISION RIGHT TOTAL HIP ARTHROPLASTY (Right)  Patient Location: PACU  Anesthesia Type: Spinal  Level of Consciousness: awake and alert   Airway and Oxygen Therapy: Patient Spontanous Breathing  Post-op Pain: mild  Post-op Assessment: Post-op Vital signs reviewed, Patient's Cardiovascular Status Stable, Respiratory Function Stable, Patent Airway and No signs of Nausea or vomiting  Last Vitals:  Filed Vitals:   05/23/13 0607  BP: 99/64  Pulse: 98  Temp: 36.7 C  Resp: 16    Post-op Vital Signs: stable   Complications: No apparent anesthesia complications

## 2013-05-23 NOTE — Op Note (Signed)
NAMEMEYA, CLUTTER              ACCOUNT NO.:  [id number]  MEDICAL RECORD NO.:  [id number]  LOCATION:  1612                         FACILITY:  [facility]  PHYSICIAN:  Madlyn Frankel. Charlann Boxer, M.D.  DATE OF BIRTH:  Nov 26, 1940  DATE OF PROCEDURE:  05/22/2013 DATE OF DISCHARGE:                              OPERATIVE REPORT   PREOPERATIVE DIAGNOSIS:  Failed right total hip replacement due to instability.  POSTOPERATIVE DIAGNOSIS:  Failed right total hip replacement due to instability.  PROCEDURE:  Revision of right total hip replacement utilizing Zimmer components, a size 52 mm continuum cup, size 36+ neutral +7 offset liner, 2 cancellous bone screws and a size 36+ 10.5 VerSys head.  SURGEON:  Madlyn Frankel. Charlann Boxer, M.D.  ASSISTANT:  Lanney Gins, PA-C.  Note that Mr. Carmon Sails was present for the entirety of the case from preoperative position, perioperative management of the operative extremity, general facilitation of the case, and primary wound closure.  ANESTHESIA:  Spinal.  SPECIMENS:  None.  COMPLICATION:  None.  ESTIMATED BLOOD LOSS:  About 750 mL.  DRAINS:  One medium Hemovac.  INDICATIONS FOR PROCEDURE:  Ms. Headlee is a 72 year old female, who was referred for evaluation of right hip due to recurrent instability and multiple dislocations in the relatively early postoperative.  She has seen and evaluated.  Radiographs were reviewed.  I had chance to review with her, the risks and benefits of surgical procedures and options available.  Surgery was scheduled and consent was obtained for stability and improved function.  PROCEDURE IN DETAIL:  The patient was brought to operative theater. Once adequate anesthesia, preoperative antibiotics, Ancef administered. The patient was positioned into the left lateral decubitus position with the right side up.  The right lower extremity was then prepped and draped in sterile fashion.  Time-out was performed identifying the patient, planned  procedure, and extremity.  I did clean her hip prior to the final prep and scrubbed with Betadine scrub.  The patient's old incision was utilized sizing up portion of it.  Soft tissue dissection was carried down to the iliotibial band and gluteal fascia, which were then split for exposure of the posterior aspect.  The hip was fairly easy due to multiple dislocations.  Pseudocapsular tissue was debrided.  As I was working around the hip and slightly internally rotated, we found the hip was very stable, appeared to have some loosening due to position, but also due to some laxity of the muscles.  Both of these will be taken to account from revision standpoint.  The femoral head was then removed off of the trunnion and I then was able to place the femur anteriorly with the retractor and placing the trunnion up onto the ilium.  At this point, I removed the acetabular liner and removed the cancellous screw and the previous cup.  Then using the Zimmer explant system, I removed the acetabular shell with no significant bone loss.  The patient had a previously placed 49 mm cup, I reamed to 51 reamer and selected a 52 Continuum cup.  This cup was then impacted, changing anteversion quite significantly from the preoperative findings where did not see any exposed anterior rim.  I now  had an exposed with about 6-7 mm of anterior bone.  Using the cup position, I had at least 25 degrees of anteversion and 35 g and 40 degrees of abduction.  I placed 2 cancellous screws and trial liner.  During the trial reduction, I was going to restore more tension on the gluteus structures using the 10.5 insert.  Given all these findings, I have dislocated the hip and removed the trial liner.  The final 36 neutral +7 liner was then opened and impacted into the acetabular shell.  I selected a 36+ 10.5 ball impacted onto a clean and dry trunnion.  I was happy with the combined anteversion and the range of motion.   There was no evidence of impingement or subluxation of this system in place.  The hip had been irrigated throughout the case and again at this point. Due to the dissection and trial from dislocations, there is no significant capsular tissue posteriorly.  I did place a medium Hemovac drain deep.  I then reapproximated the iliotibial band and gluteal fascia with a combination of #1 Vicryl and 0 V-Loc suture.  The remainder of the wound was closed with 2-0 Vicryl and running 4-0 Monocryl.  The hip was then cleaned, dried, and dressed sterilely with Dermabond and Aquacel dressing.  Drain site was dressed separately.  She was then brought to recovery room in stable condition.  PLAN:  At this point, I will have her be partial weightbearing based on bone quality evident at the time of surgery for probably 4-6 weeks.     Madlyn Frankel Charlann Boxer, M.D.     MDO/MEDQ  D:  05/22/2013  T:  05/23/2013  Job:  [id number]

## 2013-05-23 NOTE — Progress Notes (Addendum)
   Subjective: 1 Day Post-Op Procedure(s) (LRB): REVISION RIGHT TOTAL HIP ARTHROPLASTY (Right)   Patient reports pain as mild, pain well controlled. No events throughout the night. She expresses her concern that the hip will dislocate, and that she is weary of this. She does complain of back pain that originated from wearing her brace to help prevent hip dislocations.   Objective:   VITALS:   Filed Vitals:   05/23/13 0607  BP: 99/64  Pulse: 98  Temp: 98.1 F (36.7 C)  Resp: 16    Neurovascular intact Dorsiflexion/Plantar flexion intact Incision: dressing C/D/I No cellulitis present Compartment soft  LABS  Recent Labs  05/22/13 1520 05/22/13 1617 05/23/13 0520  HGB 12.5 12.2 8.4*  HCT 37.5 36.0 24.4*  WBC 7.4  --  12.2*  PLT 350  --  300     Recent Labs  05/22/13 1520 05/22/13 1617 05/23/13 0520  NA 132* 134* 130*  K 4.3 4.4 4.6  BUN 12  --  14  CREATININE 0.76  --  0.70  GLUCOSE 91 95 189*     Assessment/Plan: 1 Day Post-Op Procedure(s) (LRB): REVISION RIGHT TOTAL HIP ARTHROPLASTY (Right) HV dressing d/c'ed Foley cath d/c'ed Advance diet Up with therapy D/C IV fluids Discharge to SNF when ready Discussed use of muscle relaxer to help with back pain.   Expected ABLA  Treated with iron and will observe  Obese (BMI 30-39.9) Estimated body mass index is 32.73 kg/(m^2) as calculated from the following:   Height as of this encounter: 5\' 2"  (1.575 m).   Weight as of this encounter: 81.19 kg (178 lb 15.9 oz). Patient also counseled that weight may inhibit the healing process Patient counseled that losing weight will help with future health issues  Hyponatremia Treated with IV fluids and will observe     Anastasio Auerbach. Austan Nicholl   PAC  05/23/2013, 11:56 AM

## 2013-05-23 NOTE — Evaluation (Signed)
Physical Therapy Evaluation Patient Details Name: Mary Moody MRN: [id number] DOB: 28-Jun-1941 Today's Date: 05/23/2013 Time: 1000-1055 PT Time Calculation (min): 55 min  PT Assessment / Plan / Recommendation History of Present Illness     Clinical Impression  Pt s/p R THR revision presents with decreased R LE strength/ROM, PWB status and post op pain limiting functional mobility.  Pt will benefit from follow up rehab at SNF level prior to d/c home alone.    PT Assessment  Patient needs continued PT services    Follow Up Recommendations  SNF    Does the patient have the potential to tolerate intense rehabilitation      Barriers to Discharge        Equipment Recommendations  None recommended by PT    Recommendations for Other Services OT consult   Frequency 7X/week    Precautions / Restrictions Precautions Precautions: Posterior Hip;Fall Precaution Comments: sign hung in room Restrictions Weight Bearing Restrictions: Yes RLE Weight Bearing: Partial weight bearing   Pertinent Vitals/Pain 5/10; premed, ice packs provided      Mobility  Bed Mobility Bed Mobility: Supine to Sit Supine to Sit: 3: Mod assist Details for Bed Mobility Assistance: cues for sequence and use of L LE to self assist Transfers Transfers: Sit to Stand;Stand to Sit Sit to Stand: 1: +2 Total assist;From bed Sit to Stand: Patient Percentage: 60% Stand to Sit: 1: +2 Total assist;To chair/3-in-1 Stand to Sit: Patient Percentage: 70% Details for Transfer Assistance: cues for LE management and use of UEs to self assist Ambulation/Gait Ambulation/Gait Assistance: 1: +2 Total assist Ambulation/Gait: Patient Percentage: 70% Ambulation Distance (Feet): 14 Feet Assistive device: Rolling walker Ambulation/Gait Assistance Details: cues for sequence, posture, position from RW and stride length Gait Pattern: Step-to pattern;Decreased step length - left;Antalgic;Trunk flexed Stairs: No    Exercises  Total Joint Exercises Ankle Circles/Pumps: AROM;10 reps;Supine;Both Quad Sets: AROM;10 reps;Supine;Both Heel Slides: AAROM;Supine;15 reps;Right Hip ABduction/ADduction: AAROM;10 reps;Supine;Right   PT Diagnosis: Difficulty walking  PT Problem List: Decreased strength;Decreased range of motion;Decreased activity tolerance;Decreased mobility;Decreased knowledge of use of DME;Pain;Decreased knowledge of precautions PT Treatment Interventions: DME instruction;Gait training;Functional mobility training;Therapeutic activities;Therapeutic exercise;Patient/family education     PT Goals(Current goals can be found in the care plan section) Acute Rehab PT Goals Patient Stated Goal: Rehab and HOME asap PT Goal Formulation: With patient Time For Goal Achievement: 06/06/13 Potential to Achieve Goals: Good  Visit Information  Last PT Received On: 05/23/13 Assistance Needed: +2 (saftey 2* pt c/o intermittant dizziness)       Prior Functioning  Home Living Family/patient expects to be discharged to:: Inpatient rehab Prior Function Level of Independence: Independent with assistive device(s) Communication Communication: No difficulties Dominant Hand: Right    Cognition  Cognition Arousal/Alertness: Awake/alert Behavior During Therapy: WFL for tasks assessed/performed Overall Cognitive Status: Within Functional Limits for tasks assessed    Extremity/Trunk Assessment Upper Extremity Assessment Upper Extremity Assessment: Overall WFL for tasks assessed Lower Extremity Assessment Lower Extremity Assessment: RLE deficits/detail RLE Deficits / Details: hip strength 2/5 with AAROM at hip to 75 flex and 15 abd   Balance    End of Session PT - End of Session Equipment Utilized During Treatment: Gait belt Activity Tolerance: Patient tolerated treatment well Patient left: in chair;with call bell/phone within reach Nurse Communication: Mobility status  GP     Joshua Soulier 05/23/2013, 12:45  PM

## 2013-05-23 NOTE — Care Management Note (Signed)
    Page 1 of 1   05/23/2013     1:15:23 PM   CARE MANAGEMENT NOTE 05/23/2013  Patient:  Mary Moody, Mary Moody   Account Number:  [id number]  Date Initiated:  05/23/2013  Documentation initiated by:  Colleen Can  Subjective/Objective Assessment:   dx revision rt total hip     Action/Plan:   SNF rehab   Anticipated DC Date:  05/25/2013   Anticipated DC Plan:  SKILLED NURSING FACILITY  In-house referral  Clinical Social Worker      DC Planning Services  CM consult      Choice offered to / List presented to:             Status of service:  Completed, signed off Medicare Important Message given?  NA - LOS <3 / Initial given by admissions (If response is "NO", the following Medicare IM given date fields will be blank) Date Medicare IM given:   Date Additional Medicare IM given:    Discharge Disposition:    Per UR Regulation:    If discussed at Long Length of Stay Meetings, dates discussed:    Comments:

## 2013-05-24 LAB — CBC
Hemoglobin: 7.1 g/dL — ABNORMAL LOW (ref 12.0–15.0)
Platelets: 260 10*3/uL (ref 150–400)
RBC: 2.46 MIL/uL — ABNORMAL LOW (ref 3.87–5.11)

## 2013-05-24 MED ORDER — FUROSEMIDE 10 MG/ML IJ SOLN
10.0000 mg | Freq: Once | INTRAMUSCULAR | Status: AC
  Administered 2013-05-24: 10 mg via INTRAVENOUS
  Filled 2013-05-24: qty 1

## 2013-05-24 MED ORDER — ASPIRIN EC 325 MG PO TBEC: 325.0000 mg | DELAYED_RELEASE_TABLET | Freq: Two times a day (BID) | ORAL | Status: AC

## 2013-05-24 MED ORDER — BLISTEX EX OINT
TOPICAL_OINTMENT | CUTANEOUS | Status: AC
  Administered 2013-05-24: 04:00:00
  Filled 2013-05-24: qty 10

## 2013-05-24 MED ORDER — HYDROCODONE-ACETAMINOPHEN 7.5-325 MG PO TABS: 1.0000 | ORAL_TABLET | ORAL | Status: DC | PRN

## 2013-05-24 MED ORDER — RIVAROXABAN 10 MG PO TABS: 10.0000 mg | ORAL_TABLET | Freq: Every day | ORAL | Status: DC

## 2013-05-24 MED ORDER — CYCLOBENZAPRINE HCL 10 MG PO TABS: 10.0000 mg | ORAL_TABLET | Freq: Three times a day (TID) | ORAL | Status: DC | PRN

## 2013-05-24 NOTE — Progress Notes (Signed)
   Subjective: 2 Days Post-Op Procedure(s) (LRB): REVISION RIGHT TOTAL HIP ARTHROPLASTY (Right)   Patient reports pain as mild, pain well controlled. States that the pain is much better after this surgery than the last. She does get dizzy when getting up. Discussion was had regarding blood, at this time she would like to receive 2 units. No other events throughout the night.   Objective:   VITALS:   Filed Vitals:   05/24/13  BP: 108/69  Pulse: 81  Temp: 97.7 F (36.5 C)   Resp: 16    Neurovascular intact Dorsiflexion/Plantar flexion intact Incision: dressing C/D/I No cellulitis present Compartment soft  LABS  Recent Labs  05/22/13 1520 05/22/13 1617 05/23/13 0520 05/24/13 0437  HGB 12.5 12.2 8.4* 7.1*  HCT 37.5 36.0 24.4* 21.1*  WBC 7.4  --  12.2* 9.2  PLT 350  --  300 260     Recent Labs  05/22/13 1520 05/22/13 1617 05/23/13 0520  NA 132* 134* 130*  K 4.3 4.4 4.6  BUN 12  --  14  CREATININE 0.76  --  0.70  GLUCOSE 91 95 189*     Assessment/Plan: 2 Days Post-Op Procedure(s) (LRB): REVISION RIGHT TOTAL HIP ARTHROPLASTY (Right) Up with therapy Discharge to SNF eventually when ready  ABLA  Will receive 2 units of blood today. And monitor her H&H tomorrow  Obese (BMI 30-39.9)  Estimated body mass index is 32.73 kg/(m^2) as calculated from the following:      Height as of this encounter: 5\' 2"  (1.575 m).      Weight as of this encounter: 81.19 kg (178 lb 15.9 oz).  Patient also counseled that weight may inhibit the healing process  Patient counseled that losing weight will help with future health issues   Hyponatremia  Treated with IV fluids and will observe     Anastasio Auerbach. Grisel Blumenstock   PAC  05/24/2013, 9:18 AM

## 2013-05-24 NOTE — Discharge Summary (Signed)
Physician Discharge Summary  Patient ID: Mary Moody MRN: [id number] DOB/AGE: 01/22/1941 72 y.o.  Admit date: 05/22/2013 Tentative discharge date:  05/25/2013  Procedures:  Procedure(s) (LRB): REVISION RIGHT TOTAL HIP ARTHROPLASTY (Right)  Attending Physician:  Dr. Durene Romans   Admission Diagnoses:   Right hip pain and multiple dislocations   Discharge Diagnoses:  Principal Problem:   S/P right TH revision Active Problems:   Expected blood loss anemia   Obese   Hyponatremia  Past Medical History  Diagnosis Date  . COPD (chronic obstructive pulmonary disease)   . Hypertension   . Thyroid disease   . Dislocation of right hip     03/2013   . Diabetes mellitus without complication     type II   . Anxiety   . Incontinence of urine   . Hypothyroidism   . Hyperlipidemia   . H/O hiatal hernia   . Chronic low back pain     HPI: Mary Moody, 72 y.o. female, has a history of pain and functional disability in the right hip due to 6 dislocations of her total hip arthroplasty and patient has failed non-surgical conservative treatments for greater than 12 weeks to include NSAID's and/or analgesics, use of assistive devices and activity modification. The indications for the revision total hip arthroplasty are multiple dislocations and possible malpositioned components. Onset of symptoms was abrupt starting <1 year ago with rapidlly worsening course since that time. Prior procedures on the right hip include arthroplasty. Patient currently rates pain in the right hip at 10 out of 10 with activity. There is night pain, worsening of pain with activity and weight bearing, trendelenberg gait, pain that interfers with activities of daily living, pain with passive range of motion and when it dislocates. Patient has evidence of possible malpositioned components by imaging studies. This condition presents safety issues increasing the risk of falls. There is no current active signs of  infection. Risks, benefits and expectations were discussed with the patient. Patient understand the risks, benefits and expectations and wishes to proceed with surgery.   PCP: Toma Deiters, MD   Discharged Condition: good  Hospital Course:  Patient underwent the above stated procedure on 05/22/2013. Patient tolerated the procedure well and brought to the recovery room in good condition and subsequently to the floor.  POD #1 BP: 99/64 ; Pulse: 98 ; Temp: 98.1 F (36.7 C) ; Resp: 16  Pt's foley was removed, as well as the hemovac drain removed. IV was changed to a saline lock. Patient reports pain as mild, pain well controlled. No events throughout the night. She expresses her concern that the hip will dislocate, and that she is weary of this. She does complain of back pain that originated from wearing her brace to help prevent hip dislocations. Neurovascular intact, dorsiflexion/plantar flexion intact, incision: dressing C/D/I, no cellulitis present and compartment soft.   LABS  Basename  05/23/13    0520   HGB  8.4  HCT  24.4   POD #2  BP: 108/69 ; Pulse: 81 ; Temp: 97.7 F (36.5 C) ; Resp: 16  Patient reports pain as mild, pain well controlled. States that the pain is much better after this surgery than the last. She does get dizzy when getting up. Discussion was had regarding blood, at this time she would like to receive 2 units. No other events throughout the night.  Neurovascular intact, dorsiflexion/plantar flexion intact, incision: dressing C/D/I, no cellulitis present and compartment soft.   LABS  Basename  05/24/13    0437  HGB  7.1  HCT  21.1   POD #3  Vital signs stable. No events throughout the night.  Ready to be discharged to SNF if doing well. Neurovascular intact, dorsiflexion/plantar flexion intact, incision: dressing C/D/I, no cellulitis present and compartment soft.    Discharge Exam: General appearance: alert, cooperative and no distress Extremities: Homans  sign is negative, no sign of DVT, no edema, redness or tenderness in the calves or thighs and no ulcers, gangrene or trophic changes  Disposition: Return to SNF with follow up in 2 weeks  Discharge Orders   Future Orders Complete By Expires     Call MD / Call 911  As directed     Comments:      If you experience chest pain or shortness of breath, CALL 911 and be transported to the hospital emergency room.  If you develope a fever above 101 F, pus (white drainage) or increased drainage or redness at the wound, or calf pain, call your surgeon's office.    Change dressing  As directed     Comments:      Maintain surgical dressing for 10-14 days, then replace with 4x4 guaze and tape. Keep the area dry and clean.    Constipation Prevention  As directed     Comments:      Drink plenty of fluids.  Prune juice may be helpful.  You may use a stool softener, such as Colace (over the counter) 100 mg twice a day.  Use MiraLax (over the counter) for constipation as needed.    Diet - low sodium heart healthy  As directed     Discharge instructions  As directed     Comments:      Maintain surgical dressing for 10-14 days, then replace with gauze and tape. Keep the area dry and clean until follow up. Follow up in 2 weeks at [facility]. Call with any questions or concerns.    Increase activity slowly as tolerated  As directed     Partial weight bearing  As directed     Comments:      Right leg    TED hose  As directed     Comments:      Use stockings (TED hose) for 2 weeks on both leg(s).  You may remove them at night for sleeping.         Medication List    STOP taking these medications       acetaminophen 325 MG tablet  Commonly known as:  TYLENOL     ciprofloxacin 500 MG tablet  Commonly known as:  CIPRO     enoxaparin 120 MG/0.8ML injection  Commonly known as:  LOVENOX     oxyCODONE-acetaminophen 10-325 MG per tablet  Commonly known as:  PERCOCET      TAKE these  medications       amLODipine 10 MG tablet  Commonly known as:  NORVASC  Take 10 mg by mouth every morning.     aspirin EC 325 MG tablet  Take 1 tablet (325 mg total) by mouth 2 (two) times daily. Take for 4 weeks.  Start taking on:  06/07/2013     busPIRone 15 MG tablet  Commonly known as:  BUSPAR  Take 15 mg by mouth 2 (two) times daily.     calcium-vitamin D 500-200 MG-UNIT per tablet  Commonly known as:  OSCAL WITH D  Take 1 tablet by mouth 3 (three)  times daily.     cyclobenzaprine 10 MG tablet  Commonly known as:  FLEXERIL  Take 1 tablet (10 mg total) by mouth every 8 (eight) hours as needed for muscle spasms.     darifenacin 15 MG 24 hr tablet  Commonly known as:  ENABLEX  Take 15 mg by mouth daily.     docusate sodium 100 MG capsule  Commonly known as:  COLACE  Take 100 mg by mouth 2 (two) times daily.     gemfibrozil 600 MG tablet  Commonly known as:  LOPID  Take 600 mg by mouth 2 (two) times daily before a meal.     glimepiride 2 MG tablet  Commonly known as:  AMARYL  Take 2 mg by mouth daily before breakfast.     HYDROcodone-acetaminophen 7.5-325 MG per tablet  Commonly known as:  NORCO  Take 1-2 tablets by mouth every 4 (four) hours as needed for pain.     labetalol 200 MG tablet  Commonly known as:  NORMODYNE  Take 200 mg by mouth 2 (two) times daily.     levothyroxine 100 MCG tablet  Commonly known as:  SYNTHROID, LEVOTHROID  Take 100 mcg by mouth daily before breakfast.     lisinopril 10 MG tablet  Commonly known as:  PRINIVIL,ZESTRIL  Take 10 mg by mouth daily.     multivitamin tablet  Take 1 tablet by mouth daily.     pantoprazole 40 MG tablet  Commonly known as:  PROTONIX  Take 40 mg by mouth daily.     polyethylene glycol packet  Commonly known as:  MIRALAX / GLYCOLAX  Take 17 g by mouth daily.     rivaroxaban 10 MG Tabs tablet  Commonly known as:  XARELTO  Take 1 tablet (10 mg total) by mouth daily.     Vitamin D-3 1000 UNITS Caps   Take 1,000 Units by mouth daily.         Signed: Anastasio Auerbach. Osama Coleson   PAC  05/24/2013, 9:31 AM

## 2013-05-24 NOTE — Progress Notes (Signed)
Physical Therapy Treatment Patient Details Name: Mary Moody MRN: [id number] DOB: 04/27/1941 Today's Date: 05/24/2013 Time: 4098-1191 PT Time Calculation (min): 38 min  PT Assessment / Plan / Recommendation  PT Comments   Pt to/from bathroom including performing hygiene in standing at sink with c/o ongoing mild dizziness.  Follow Up Recommendations  SNF     Does the patient have the potential to tolerate intense rehabilitation     Barriers to Discharge        Equipment Recommendations  None recommended by PT    Recommendations for Other Services OT consult  Frequency 7X/week   Progress towards PT Goals Progress towards PT goals: Progressing toward goals  Plan Current plan remains appropriate    Precautions / Restrictions Precautions Precautions: Posterior Hip;Fall Precaution Comments: pt recalls all THP Restrictions Weight Bearing Restrictions: Yes RLE Weight Bearing: Partial weight bearing   Pertinent Vitals/Pain 4/10; premed, ice pack provided    Mobility  Bed Mobility Bed Mobility: Supine to Sit Supine to Sit: 3: Mod assist Details for Bed Mobility Assistance: cues for sequence  Transfers Transfers: Sit to Stand;Stand to Sit Sit to Stand: 3: Mod assist;4: Min assist;From bed;From chair/3-in-1;With upper extremity assist Stand to Sit: 4: Min assist;With upper extremity assist;To chair/3-in-1;With armrests Details for Transfer Assistance: cues for LE management and use of UEs to self assist Ambulation/Gait Ambulation/Gait Assistance: 3: Mod assist;4: Min assist Ambulation Distance (Feet): 25 Feet (and 22') Assistive device: Rolling walker Ambulation/Gait Assistance Details: cues for sequence, posture and position from RW Gait Pattern: Step-to pattern;Decreased step length - left;Trunk flexed General Gait Details: Ltd by c/o ongoing dizziness Stairs: No    Exercises Total Joint Exercises Ankle Circles/Pumps: AROM;Supine;Both;15 reps Quad Sets:  AROM;Supine;Both;20 reps Gluteal Sets: AROM;10 reps;Both;Supine Heel Slides: AAROM;Supine;Right;20 reps Hip ABduction/ADduction: AAROM;Supine;Right;20 reps   PT Diagnosis:    PT Problem List:   PT Treatment Interventions:     PT Goals (current goals can now be found in the care plan section) Acute Rehab PT Goals Patient Stated Goal: Rehab and HOME asap PT Goal Formulation: With patient Time For Goal Achievement: 06/06/13 Potential to Achieve Goals: Good  Visit Information  Last PT Received On: 05/24/13 Assistance Needed: +1    Subjective Data  Subjective: I'm just a little dizzy but they are going to give me blood today Patient Stated Goal: Rehab and HOME asap   Cognition  Cognition Arousal/Alertness: Awake/alert Behavior During Therapy: WFL for tasks assessed/performed Overall Cognitive Status: Within Functional Limits for tasks assessed    Balance     End of Session PT - End of Session Equipment Utilized During Treatment: Gait belt Activity Tolerance: Patient tolerated treatment well Patient left: in chair;with call bell/phone within reach Nurse Communication: Mobility status   GP     Mary Moody 05/24/2013, 12:33 PM

## 2013-05-24 NOTE — Progress Notes (Signed)
Clinical Social Work  Preliminary DC summary faxed to [facility] who is agreeable to accept tomorrow if medically stable.  Unk Lightning, LCSW  (Coverage for [employer])

## 2013-05-24 NOTE — Progress Notes (Signed)
Physical Therapy Treatment Patient Details Name: Mary Moody MRN: [id number] DOB: [birth year] Today's Date: 05/24/2013 Time: 4098-1191 PT Time Calculation (min): 29 min  PT Assessment / Plan / Recommendation  PT Comments     Follow Up Recommendations  SNF     Does the patient have the potential to tolerate intense rehabilitation     Barriers to Discharge        Equipment Recommendations  None recommended by PT    Recommendations for Other Services OT consult  Frequency 7X/week   Progress towards PT Goals Progress towards PT goals: Progressing toward goals  Plan Current plan remains appropriate    Precautions / Restrictions Precautions Precautions: Posterior Hip;Fall Precaution Comments: pt recalls all THP Restrictions Weight Bearing Restrictions: Yes RLE Weight Bearing: Partial weight bearing   Pertinent Vitals/Pain 6/10; Meds requested, ice pack provided    Mobility  Bed Mobility Bed Mobility: Sit to Supine Sit to Supine: 3: Mod assist Details for Bed Mobility Assistance: cues for sequence  Transfers Transfers: Sit to Stand;Stand to Sit Sit to Stand: 4: Min assist;3: Mod assist Stand to Sit: 4: Min assist;3: Mod assist Details for Transfer Assistance: cues for LE management and use of UEs to self assist Ambulation/Gait Ambulation/Gait Assistance: 4: Min assist Ambulation Distance (Feet): 33 Feet Assistive device: Rolling walker Ambulation/Gait Assistance Details: cues for posture, stride length, position from RW and sequence Gait Pattern: Step-to pattern;Decreased step length - left;Trunk flexed Stairs: No    Exercises     PT Diagnosis:    PT Problem List:   PT Treatment Interventions:     PT Goals (current goals can now be found in the care plan section) Acute Rehab PT Goals Patient Stated Goal: Rehab and HOME asap PT Goal Formulation: With patient Time For Goal Achievement: 06/06/13 Potential to Achieve Goals: Good  Visit Information  Last PT  Received On: 05/24/13 Assistance Needed: +1    Subjective Data  Subjective: I'm still a little lightheaded but doing better Patient Stated Goal: Rehab and HOME asap   Cognition  Cognition Arousal/Alertness: Awake/alert Behavior During Therapy: WFL for tasks assessed/performed Overall Cognitive Status: Within Functional Limits for tasks assessed    Balance     End of Session PT - End of Session Equipment Utilized During Treatment: Gait belt Activity Tolerance: Patient tolerated treatment well Patient left: with call bell/phone within reach;in bed;with nursing/sitter in room Nurse Communication: Mobility status   GP     Mary Moody 05/24/2013, 3:47 PM

## 2013-05-25 DIAGNOSIS — E119 Type 2 diabetes mellitus without complications: Secondary | ICD-10-CM | POA: Diagnosis not present

## 2013-05-25 DIAGNOSIS — Z5189 Encounter for other specified aftercare: Secondary | ICD-10-CM | POA: Diagnosis not present

## 2013-05-25 DIAGNOSIS — Z471 Aftercare following joint replacement surgery: Secondary | ICD-10-CM | POA: Diagnosis not present

## 2013-05-25 DIAGNOSIS — M169 Osteoarthritis of hip, unspecified: Secondary | ICD-10-CM | POA: Diagnosis not present

## 2013-05-25 DIAGNOSIS — R269 Unspecified abnormalities of gait and mobility: Secondary | ICD-10-CM | POA: Diagnosis not present

## 2013-05-25 DIAGNOSIS — F411 Generalized anxiety disorder: Secondary | ICD-10-CM | POA: Diagnosis not present

## 2013-05-25 DIAGNOSIS — R279 Unspecified lack of coordination: Secondary | ICD-10-CM | POA: Diagnosis not present

## 2013-05-25 DIAGNOSIS — E039 Hypothyroidism, unspecified: Secondary | ICD-10-CM | POA: Diagnosis not present

## 2013-05-25 DIAGNOSIS — K219 Gastro-esophageal reflux disease without esophagitis: Secondary | ICD-10-CM | POA: Diagnosis not present

## 2013-05-25 DIAGNOSIS — E785 Hyperlipidemia, unspecified: Secondary | ICD-10-CM | POA: Diagnosis not present

## 2013-05-25 DIAGNOSIS — M545 Low back pain: Secondary | ICD-10-CM | POA: Diagnosis not present

## 2013-05-25 DIAGNOSIS — M25559 Pain in unspecified hip: Secondary | ICD-10-CM | POA: Diagnosis not present

## 2013-05-25 DIAGNOSIS — R293 Abnormal posture: Secondary | ICD-10-CM | POA: Diagnosis not present

## 2013-05-25 DIAGNOSIS — Z96649 Presence of unspecified artificial hip joint: Secondary | ICD-10-CM | POA: Diagnosis not present

## 2013-05-25 DIAGNOSIS — M6281 Muscle weakness (generalized): Secondary | ICD-10-CM | POA: Diagnosis not present

## 2013-05-25 DIAGNOSIS — S79919A Unspecified injury of unspecified hip, initial encounter: Secondary | ICD-10-CM | POA: Diagnosis not present

## 2013-05-25 DIAGNOSIS — I10 Essential (primary) hypertension: Secondary | ICD-10-CM | POA: Diagnosis not present

## 2013-05-25 LAB — TYPE AND SCREEN
ABO/RH(D): O NEG
Unit division: 0

## 2013-05-25 LAB — CBC
HCT: 29 % — ABNORMAL LOW (ref 36.0–46.0)
Hemoglobin: 9.7 g/dL — ABNORMAL LOW (ref 12.0–15.0)
MCH: 28.9 pg (ref 26.0–34.0)
MCHC: 33.8 g/dL (ref 30.0–36.0)
RDW: 15.4 % (ref 11.5–15.5)

## 2013-05-25 NOTE — Progress Notes (Signed)
Physical Therapy Treatment Patient Details Name: Mary Moody MRN: [id number] DOB: Jan 19, 1941 Today's Date: 05/25/2013 Time: 4098-1191 PT Time Calculation (min): 30 min  PT Assessment / Plan / Recommendation  PT Comments     Follow Up Recommendations  SNF     Does the patient have the potential to tolerate intense rehabilitation     Barriers to Discharge        Equipment Recommendations  None recommended by PT    Recommendations for Other Services OT consult  Frequency 7X/week   Progress towards PT Goals Progress towards PT goals: Progressing toward goals  Plan Current plan remains appropriate    Precautions / Restrictions Precautions Precautions: Posterior Hip;Fall Precaution Comments: pt recalls all THP Restrictions Weight Bearing Restrictions: Yes RLE Weight Bearing: Partial weight bearing   Pertinent Vitals/Pain 5/10; premed, ice pack provided    Mobility  Bed Mobility Bed Mobility: Sit to Supine Supine to Sit: 3: Mod assist Details for Bed Mobility Assistance: cues for sequence  Transfers Transfers: Sit to Stand;Stand to Sit Sit to Stand: 1: +2 Total assist;From chair/3-in-1 Sit to Stand: Patient Percentage: 80% Stand to Sit: 1: +2 Total assist;To bed Stand to Sit: Patient Percentage: 80% Details for Transfer Assistance: cues LE management Ambulation/Gait Ambulation/Gait Assistance: 4: Min assist Ambulation Distance (Feet): 40 Feet (2x40; and 25') Assistive device: Rolling walker Ambulation/Gait Assistance Details: cues for posture, position from RW and PWB Gait Pattern: Step-to pattern;Decreased step length - left;Trunk flexed Stairs: No    Exercises     PT Diagnosis:    PT Problem List:   PT Treatment Interventions:     PT Goals (current goals can now be found in the care plan section) Acute Rehab PT Goals Patient Stated Goal: Rehab and HOME asap PT Goal Formulation: With patient Time For Goal Achievement: 06/06/13 Potential to Achieve  Goals: Good  Visit Information  Last PT Received On: 05/25/13 Assistance Needed: +2 (due to dizziness)    Subjective Data  Subjective: I should be leaving today Patient Stated Goal: Rehab and HOME asap   Cognition  Cognition Arousal/Alertness: Awake/alert Behavior During Therapy: WFL for tasks assessed/performed Overall Cognitive Status: Within Functional Limits for tasks assessed    Balance     End of Session PT - End of Session Equipment Utilized During Treatment: Gait belt Activity Tolerance: Patient tolerated treatment well Patient left: with call bell/phone within reach;in chair Nurse Communication: Mobility status   GP     [name] 05/25/2013, 12:34 PM

## 2013-05-25 NOTE — Evaluation (Signed)
Occupational Therapy Evaluation Patient Details Name: Mary Moody MRN: [id number] DOB: 1941/06/11 Today's Date: 05/25/2013 Time:  -     OT Assessment / Plan / Recommendation History of present illness     Clinical Impression   Pt was admitted for R THR. She is PWB and has THPs.  She plans to return to [facility] for continued therapy and will benefit from skilled OT in acute.      OT Assessment  Patient needs continued OT Services    Follow Up Recommendations   (return to [facility])    Barriers to Discharge      Equipment Recommendations  3 in 1 bedside comode    Recommendations for Other Services    Frequency  Min 2X/week    Precautions / Restrictions Precautions Precautions: Posterior Hip;Fall Precaution Comments: pt recalls all THP Restrictions RLE Weight Bearing: Partial weight bearing   Pertinent Vitals/Pain 6/10 R hip   ADL  Toilet Transfer: +2 Total assistance Toilet Transfer: Patient Percentage: 80% Toilet Transfer Method: Stand pivot Toilet Transfer Equipment: Bedside commode Toileting - Clothing Manipulation and Hygiene: +2 Total assistance Toileting - Clothing Manipulation and Hygiene: Patient Percentage: 0% Equipment Used: Rolling walker Transfers/Ambulation Related to ADLs: Pt fatiqued.  Attempted to use 3;1 prior to going back to bed.   ADL Comments: Pt had first sx in April.  Was not following THPs prior to this admission, although she has used AE.  Pt intially recalled 2/3 thps then recalled all 3 at end of session.  Pt is fearful of moving due to multiple dislocations.  She c/o dizziness so had +2 for transfers (NT onto commode and PT off).      OT Diagnosis: Generalized weakness  OT Problem List: Decreased strength;Pain;Decreased knowledge of precautions;Decreased knowledge of use of DME or AE OT Treatment Interventions: Self-care/ADL training;DME and/or AE instruction;Patient/family education   OT Goals(Current goals can be found in the  care plan section) Acute Rehab OT Goals Patient Stated Goal: Rehab and HOME asap OT Goal Formulation: With patient Time For Goal Achievement: 05/30/13 Potential to Achieve Goals: Good  Visit Information  Assistance Needed: +2 (due to dizziness) PT/OT Co-Evaluation/Treatment:  (overlapped)       Prior Functioning     Home Living Family/patient expects to be discharged to:: Inpatient rehab ([facility]) Prior Function Level of Independence: Independent with assistive device(s) Communication Communication: No difficulties Dominant Hand: Right         Vision/Perception     Cognition  Cognition Arousal/Alertness: Awake/alert Behavior During Therapy: WFL for tasks assessed/performed Overall Cognitive Status: Within Functional Limits for tasks assessed    Extremity/Trunk Assessment Upper Extremity Assessment Upper Extremity Assessment: Overall WFL for tasks assessed     Mobility Bed Mobility Bed Mobility: Sit to Supine Supine to Sit: 3: Mod assist Details for Bed Mobility Assistance: cues for sequence  Transfers Sit to Stand: 1: +2 Total assist;From chair/3-in-1 Sit to Stand: Patient Percentage: 80% Stand to Sit: 1: +2 Total assist;To bed Stand to Sit: Patient Percentage: 80% Details for Transfer Assistance: cues LE management     Exercise     Balance     End of Session OT - End of Session Activity Tolerance: Patient limited by fatigue Patient left: in bed;with call bell/phone within reach  GO     [facility] 05/25/2013, 11:11 AM Pulled into note from 05/23/13 at 1400 [place], OTR/L [phone] 05/25/2013

## 2013-05-25 NOTE — Progress Notes (Signed)
Mary Moody  MRN: [id number] DOB/Age: [birth year] 72 y.o. Physician: Lynnea Maizes, M.D. 3 Days Post-Op Procedure(s) (LRB): REVISION RIGHT TOTAL HIP ARTHROPLASTY (Right)  Subjective: No c/o. Anxious for transfer to SNF Vital Signs Temp:  [97.4 F (36.3 C)-98.3 F (36.8 C)] 98.1 F (36.7 C) (07/04 0607) Pulse Rate:  [74-87] 74 (07/04 0607) Resp:  [16-18] 16 (07/04 0607) BP: (97-154)/(58-95) 154/95 mmHg (07/04 0607) SpO2:  [97 %-100 %] 98 % (07/04 0607)  Lab Results  Recent Labs  05/24/13 0437 05/25/13 0443  WBC 9.2 9.3  HGB 7.1* 9.7*  HCT 21.1* 29.0*  PLT 260 249   BMET  Recent Labs  05/22/13 1520 05/22/13 1617 05/23/13 0520  NA 132* 134* 130*  K 4.3 4.4 4.6  CL 97  --  96  CO2 25  --  24  GLUCOSE 91 95 189*  BUN 12  --  14  CREATININE 0.76  --  0.70  CALCIUM 11.3*  --  9.4   INR  Date Value Range Status  05/22/2013 1.17  0.00 - 1.49 Final     Exam  Good mobility  Plan To SNF  Abbeygail Igoe M 05/25/2013, 8:27 AM

## 2013-05-29 ENCOUNTER — Encounter (HOSPITAL_COMMUNITY): Payer: Self-pay | Admitting: Orthopedic Surgery

## 2013-06-06 DIAGNOSIS — M169 Osteoarthritis of hip, unspecified: Secondary | ICD-10-CM | POA: Diagnosis not present

## 2013-06-13 DIAGNOSIS — Z471 Aftercare following joint replacement surgery: Secondary | ICD-10-CM | POA: Diagnosis not present

## 2013-06-13 DIAGNOSIS — R32 Unspecified urinary incontinence: Secondary | ICD-10-CM | POA: Diagnosis not present

## 2013-06-13 DIAGNOSIS — F411 Generalized anxiety disorder: Secondary | ICD-10-CM | POA: Diagnosis not present

## 2013-06-13 DIAGNOSIS — I10 Essential (primary) hypertension: Secondary | ICD-10-CM | POA: Diagnosis not present

## 2013-06-13 DIAGNOSIS — M545 Low back pain: Secondary | ICD-10-CM | POA: Diagnosis not present

## 2013-06-13 DIAGNOSIS — E119 Type 2 diabetes mellitus without complications: Secondary | ICD-10-CM | POA: Diagnosis not present

## 2013-06-15 DIAGNOSIS — M545 Low back pain: Secondary | ICD-10-CM | POA: Diagnosis not present

## 2013-06-15 DIAGNOSIS — I10 Essential (primary) hypertension: Secondary | ICD-10-CM | POA: Diagnosis not present

## 2013-06-15 DIAGNOSIS — Z471 Aftercare following joint replacement surgery: Secondary | ICD-10-CM | POA: Diagnosis not present

## 2013-06-15 DIAGNOSIS — F411 Generalized anxiety disorder: Secondary | ICD-10-CM | POA: Diagnosis not present

## 2013-06-15 DIAGNOSIS — R32 Unspecified urinary incontinence: Secondary | ICD-10-CM | POA: Diagnosis not present

## 2013-06-15 DIAGNOSIS — E119 Type 2 diabetes mellitus without complications: Secondary | ICD-10-CM | POA: Diagnosis not present

## 2013-06-17 DIAGNOSIS — R32 Unspecified urinary incontinence: Secondary | ICD-10-CM | POA: Diagnosis not present

## 2013-06-17 DIAGNOSIS — I10 Essential (primary) hypertension: Secondary | ICD-10-CM | POA: Diagnosis not present

## 2013-06-17 DIAGNOSIS — F411 Generalized anxiety disorder: Secondary | ICD-10-CM | POA: Diagnosis not present

## 2013-06-17 DIAGNOSIS — M545 Low back pain: Secondary | ICD-10-CM | POA: Diagnosis not present

## 2013-06-17 DIAGNOSIS — Z471 Aftercare following joint replacement surgery: Secondary | ICD-10-CM | POA: Diagnosis not present

## 2013-06-17 DIAGNOSIS — E119 Type 2 diabetes mellitus without complications: Secondary | ICD-10-CM | POA: Diagnosis not present

## 2013-06-18 DIAGNOSIS — E039 Hypothyroidism, unspecified: Secondary | ICD-10-CM | POA: Diagnosis not present

## 2013-06-18 DIAGNOSIS — R32 Unspecified urinary incontinence: Secondary | ICD-10-CM | POA: Diagnosis not present

## 2013-06-18 DIAGNOSIS — I10 Essential (primary) hypertension: Secondary | ICD-10-CM | POA: Diagnosis not present

## 2013-06-18 DIAGNOSIS — F411 Generalized anxiety disorder: Secondary | ICD-10-CM | POA: Diagnosis not present

## 2013-06-18 DIAGNOSIS — E785 Hyperlipidemia, unspecified: Secondary | ICD-10-CM | POA: Diagnosis not present

## 2013-06-18 DIAGNOSIS — Z471 Aftercare following joint replacement surgery: Secondary | ICD-10-CM | POA: Diagnosis not present

## 2013-06-18 DIAGNOSIS — J301 Allergic rhinitis due to pollen: Secondary | ICD-10-CM | POA: Diagnosis not present

## 2013-06-18 DIAGNOSIS — M545 Low back pain: Secondary | ICD-10-CM | POA: Diagnosis not present

## 2013-06-18 DIAGNOSIS — E119 Type 2 diabetes mellitus without complications: Secondary | ICD-10-CM | POA: Diagnosis not present

## 2013-06-20 DIAGNOSIS — I10 Essential (primary) hypertension: Secondary | ICD-10-CM | POA: Diagnosis not present

## 2013-06-20 DIAGNOSIS — F411 Generalized anxiety disorder: Secondary | ICD-10-CM | POA: Diagnosis not present

## 2013-06-20 DIAGNOSIS — M545 Low back pain: Secondary | ICD-10-CM | POA: Diagnosis not present

## 2013-06-20 DIAGNOSIS — E119 Type 2 diabetes mellitus without complications: Secondary | ICD-10-CM | POA: Diagnosis not present

## 2013-06-20 DIAGNOSIS — Z471 Aftercare following joint replacement surgery: Secondary | ICD-10-CM | POA: Diagnosis not present

## 2013-06-20 DIAGNOSIS — R32 Unspecified urinary incontinence: Secondary | ICD-10-CM | POA: Diagnosis not present

## 2013-06-21 DIAGNOSIS — M545 Low back pain: Secondary | ICD-10-CM | POA: Diagnosis not present

## 2013-06-21 DIAGNOSIS — R32 Unspecified urinary incontinence: Secondary | ICD-10-CM | POA: Diagnosis not present

## 2013-06-21 DIAGNOSIS — I10 Essential (primary) hypertension: Secondary | ICD-10-CM | POA: Diagnosis not present

## 2013-06-21 DIAGNOSIS — F411 Generalized anxiety disorder: Secondary | ICD-10-CM | POA: Diagnosis not present

## 2013-06-21 DIAGNOSIS — Z471 Aftercare following joint replacement surgery: Secondary | ICD-10-CM | POA: Diagnosis not present

## 2013-06-21 DIAGNOSIS — E119 Type 2 diabetes mellitus without complications: Secondary | ICD-10-CM | POA: Diagnosis not present

## 2013-06-22 DIAGNOSIS — E119 Type 2 diabetes mellitus without complications: Secondary | ICD-10-CM | POA: Diagnosis not present

## 2013-06-22 DIAGNOSIS — M545 Low back pain: Secondary | ICD-10-CM | POA: Diagnosis not present

## 2013-06-22 DIAGNOSIS — Z471 Aftercare following joint replacement surgery: Secondary | ICD-10-CM | POA: Diagnosis not present

## 2013-06-22 DIAGNOSIS — R32 Unspecified urinary incontinence: Secondary | ICD-10-CM | POA: Diagnosis not present

## 2013-06-22 DIAGNOSIS — F411 Generalized anxiety disorder: Secondary | ICD-10-CM | POA: Diagnosis not present

## 2013-06-22 DIAGNOSIS — I10 Essential (primary) hypertension: Secondary | ICD-10-CM | POA: Diagnosis not present

## 2013-06-25 DIAGNOSIS — F411 Generalized anxiety disorder: Secondary | ICD-10-CM | POA: Diagnosis not present

## 2013-06-25 DIAGNOSIS — R32 Unspecified urinary incontinence: Secondary | ICD-10-CM | POA: Diagnosis not present

## 2013-06-25 DIAGNOSIS — Z471 Aftercare following joint replacement surgery: Secondary | ICD-10-CM | POA: Diagnosis not present

## 2013-06-25 DIAGNOSIS — E119 Type 2 diabetes mellitus without complications: Secondary | ICD-10-CM | POA: Diagnosis not present

## 2013-06-25 DIAGNOSIS — M545 Low back pain: Secondary | ICD-10-CM | POA: Diagnosis not present

## 2013-06-25 DIAGNOSIS — I10 Essential (primary) hypertension: Secondary | ICD-10-CM | POA: Diagnosis not present

## 2013-06-26 DIAGNOSIS — Z471 Aftercare following joint replacement surgery: Secondary | ICD-10-CM | POA: Diagnosis not present

## 2013-06-26 DIAGNOSIS — R32 Unspecified urinary incontinence: Secondary | ICD-10-CM | POA: Diagnosis not present

## 2013-06-26 DIAGNOSIS — M545 Low back pain: Secondary | ICD-10-CM | POA: Diagnosis not present

## 2013-06-26 DIAGNOSIS — I10 Essential (primary) hypertension: Secondary | ICD-10-CM | POA: Diagnosis not present

## 2013-06-26 DIAGNOSIS — E119 Type 2 diabetes mellitus without complications: Secondary | ICD-10-CM | POA: Diagnosis not present

## 2013-06-26 DIAGNOSIS — F411 Generalized anxiety disorder: Secondary | ICD-10-CM | POA: Diagnosis not present

## 2013-06-27 DIAGNOSIS — F411 Generalized anxiety disorder: Secondary | ICD-10-CM | POA: Diagnosis not present

## 2013-06-27 DIAGNOSIS — I10 Essential (primary) hypertension: Secondary | ICD-10-CM | POA: Diagnosis not present

## 2013-06-27 DIAGNOSIS — R32 Unspecified urinary incontinence: Secondary | ICD-10-CM | POA: Diagnosis not present

## 2013-06-27 DIAGNOSIS — M545 Low back pain: Secondary | ICD-10-CM | POA: Diagnosis not present

## 2013-06-27 DIAGNOSIS — E119 Type 2 diabetes mellitus without complications: Secondary | ICD-10-CM | POA: Diagnosis not present

## 2013-06-27 DIAGNOSIS — Z471 Aftercare following joint replacement surgery: Secondary | ICD-10-CM | POA: Diagnosis not present

## 2013-06-28 DIAGNOSIS — E119 Type 2 diabetes mellitus without complications: Secondary | ICD-10-CM | POA: Diagnosis not present

## 2013-06-28 DIAGNOSIS — I10 Essential (primary) hypertension: Secondary | ICD-10-CM | POA: Diagnosis not present

## 2013-06-28 DIAGNOSIS — M545 Low back pain: Secondary | ICD-10-CM | POA: Diagnosis not present

## 2013-06-28 DIAGNOSIS — F411 Generalized anxiety disorder: Secondary | ICD-10-CM | POA: Diagnosis not present

## 2013-06-28 DIAGNOSIS — Z471 Aftercare following joint replacement surgery: Secondary | ICD-10-CM | POA: Diagnosis not present

## 2013-06-28 DIAGNOSIS — R32 Unspecified urinary incontinence: Secondary | ICD-10-CM | POA: Diagnosis not present

## 2013-07-02 DIAGNOSIS — F411 Generalized anxiety disorder: Secondary | ICD-10-CM | POA: Diagnosis not present

## 2013-07-02 DIAGNOSIS — R32 Unspecified urinary incontinence: Secondary | ICD-10-CM | POA: Diagnosis not present

## 2013-07-02 DIAGNOSIS — M545 Low back pain: Secondary | ICD-10-CM | POA: Diagnosis not present

## 2013-07-02 DIAGNOSIS — E119 Type 2 diabetes mellitus without complications: Secondary | ICD-10-CM | POA: Diagnosis not present

## 2013-07-02 DIAGNOSIS — I10 Essential (primary) hypertension: Secondary | ICD-10-CM | POA: Diagnosis not present

## 2013-07-02 DIAGNOSIS — Z471 Aftercare following joint replacement surgery: Secondary | ICD-10-CM | POA: Diagnosis not present

## 2013-07-04 DIAGNOSIS — I10 Essential (primary) hypertension: Secondary | ICD-10-CM | POA: Diagnosis not present

## 2013-07-04 DIAGNOSIS — F411 Generalized anxiety disorder: Secondary | ICD-10-CM | POA: Diagnosis not present

## 2013-07-04 DIAGNOSIS — R32 Unspecified urinary incontinence: Secondary | ICD-10-CM | POA: Diagnosis not present

## 2013-07-04 DIAGNOSIS — Z471 Aftercare following joint replacement surgery: Secondary | ICD-10-CM | POA: Diagnosis not present

## 2013-07-04 DIAGNOSIS — E119 Type 2 diabetes mellitus without complications: Secondary | ICD-10-CM | POA: Diagnosis not present

## 2013-07-04 DIAGNOSIS — M545 Low back pain: Secondary | ICD-10-CM | POA: Diagnosis not present

## 2013-07-05 DIAGNOSIS — I10 Essential (primary) hypertension: Secondary | ICD-10-CM | POA: Diagnosis not present

## 2013-07-05 DIAGNOSIS — F411 Generalized anxiety disorder: Secondary | ICD-10-CM | POA: Diagnosis not present

## 2013-07-05 DIAGNOSIS — E119 Type 2 diabetes mellitus without complications: Secondary | ICD-10-CM | POA: Diagnosis not present

## 2013-07-05 DIAGNOSIS — Z471 Aftercare following joint replacement surgery: Secondary | ICD-10-CM | POA: Diagnosis not present

## 2013-07-05 DIAGNOSIS — R32 Unspecified urinary incontinence: Secondary | ICD-10-CM | POA: Diagnosis not present

## 2013-07-05 DIAGNOSIS — M545 Low back pain: Secondary | ICD-10-CM | POA: Diagnosis not present

## 2013-07-09 DIAGNOSIS — F411 Generalized anxiety disorder: Secondary | ICD-10-CM | POA: Diagnosis not present

## 2013-07-09 DIAGNOSIS — Z471 Aftercare following joint replacement surgery: Secondary | ICD-10-CM | POA: Diagnosis not present

## 2013-07-09 DIAGNOSIS — R32 Unspecified urinary incontinence: Secondary | ICD-10-CM | POA: Diagnosis not present

## 2013-07-09 DIAGNOSIS — M545 Low back pain: Secondary | ICD-10-CM | POA: Diagnosis not present

## 2013-07-09 DIAGNOSIS — I10 Essential (primary) hypertension: Secondary | ICD-10-CM | POA: Diagnosis not present

## 2013-07-09 DIAGNOSIS — E119 Type 2 diabetes mellitus without complications: Secondary | ICD-10-CM | POA: Diagnosis not present

## 2013-07-11 DIAGNOSIS — I10 Essential (primary) hypertension: Secondary | ICD-10-CM | POA: Diagnosis not present

## 2013-07-11 DIAGNOSIS — Z471 Aftercare following joint replacement surgery: Secondary | ICD-10-CM | POA: Diagnosis not present

## 2013-07-11 DIAGNOSIS — R32 Unspecified urinary incontinence: Secondary | ICD-10-CM | POA: Diagnosis not present

## 2013-07-11 DIAGNOSIS — E119 Type 2 diabetes mellitus without complications: Secondary | ICD-10-CM | POA: Diagnosis not present

## 2013-07-11 DIAGNOSIS — F411 Generalized anxiety disorder: Secondary | ICD-10-CM | POA: Diagnosis not present

## 2013-07-11 DIAGNOSIS — M545 Low back pain: Secondary | ICD-10-CM | POA: Diagnosis not present

## 2013-07-12 DIAGNOSIS — M5137 Other intervertebral disc degeneration, lumbosacral region: Secondary | ICD-10-CM | POA: Diagnosis not present

## 2013-07-12 DIAGNOSIS — M51379 Other intervertebral disc degeneration, lumbosacral region without mention of lumbar back pain or lower extremity pain: Secondary | ICD-10-CM | POA: Diagnosis not present

## 2013-07-12 DIAGNOSIS — Z96649 Presence of unspecified artificial hip joint: Secondary | ICD-10-CM | POA: Diagnosis not present

## 2013-07-13 DIAGNOSIS — F411 Generalized anxiety disorder: Secondary | ICD-10-CM | POA: Diagnosis not present

## 2013-07-13 DIAGNOSIS — I10 Essential (primary) hypertension: Secondary | ICD-10-CM | POA: Diagnosis not present

## 2013-07-13 DIAGNOSIS — E119 Type 2 diabetes mellitus without complications: Secondary | ICD-10-CM | POA: Diagnosis not present

## 2013-07-13 DIAGNOSIS — R32 Unspecified urinary incontinence: Secondary | ICD-10-CM | POA: Diagnosis not present

## 2013-07-13 DIAGNOSIS — M545 Low back pain: Secondary | ICD-10-CM | POA: Diagnosis not present

## 2013-07-13 DIAGNOSIS — Z471 Aftercare following joint replacement surgery: Secondary | ICD-10-CM | POA: Diagnosis not present

## 2013-07-18 DIAGNOSIS — Z471 Aftercare following joint replacement surgery: Secondary | ICD-10-CM | POA: Diagnosis not present

## 2013-07-18 DIAGNOSIS — R32 Unspecified urinary incontinence: Secondary | ICD-10-CM | POA: Diagnosis not present

## 2013-07-18 DIAGNOSIS — M545 Low back pain: Secondary | ICD-10-CM | POA: Diagnosis not present

## 2013-07-18 DIAGNOSIS — F411 Generalized anxiety disorder: Secondary | ICD-10-CM | POA: Diagnosis not present

## 2013-07-18 DIAGNOSIS — I10 Essential (primary) hypertension: Secondary | ICD-10-CM | POA: Diagnosis not present

## 2013-07-18 DIAGNOSIS — E119 Type 2 diabetes mellitus without complications: Secondary | ICD-10-CM | POA: Diagnosis not present

## 2013-08-02 DIAGNOSIS — H353190 Nonexudative age-related macular degeneration, unspecified eye, stage unspecified: Secondary | ICD-10-CM | POA: Diagnosis not present

## 2013-08-06 DIAGNOSIS — J301 Allergic rhinitis due to pollen: Secondary | ICD-10-CM | POA: Diagnosis not present

## 2013-08-10 DIAGNOSIS — E119 Type 2 diabetes mellitus without complications: Secondary | ICD-10-CM | POA: Diagnosis not present

## 2013-08-10 DIAGNOSIS — R32 Unspecified urinary incontinence: Secondary | ICD-10-CM | POA: Diagnosis not present

## 2013-08-10 DIAGNOSIS — M545 Low back pain: Secondary | ICD-10-CM | POA: Diagnosis not present

## 2013-08-10 DIAGNOSIS — Z471 Aftercare following joint replacement surgery: Secondary | ICD-10-CM | POA: Diagnosis not present

## 2013-08-10 DIAGNOSIS — F411 Generalized anxiety disorder: Secondary | ICD-10-CM | POA: Diagnosis not present

## 2013-08-10 DIAGNOSIS — I10 Essential (primary) hypertension: Secondary | ICD-10-CM | POA: Diagnosis not present

## 2013-08-24 DIAGNOSIS — Z96649 Presence of unspecified artificial hip joint: Secondary | ICD-10-CM | POA: Diagnosis not present

## 2013-08-24 DIAGNOSIS — M1710 Unilateral primary osteoarthritis, unspecified knee: Secondary | ICD-10-CM | POA: Diagnosis not present

## 2013-10-01 DIAGNOSIS — Z23 Encounter for immunization: Secondary | ICD-10-CM | POA: Diagnosis not present

## 2013-10-05 DIAGNOSIS — E782 Mixed hyperlipidemia: Secondary | ICD-10-CM | POA: Diagnosis not present

## 2013-12-31 DIAGNOSIS — I10 Essential (primary) hypertension: Secondary | ICD-10-CM | POA: Diagnosis not present

## 2013-12-31 DIAGNOSIS — IMO0001 Reserved for inherently not codable concepts without codable children: Secondary | ICD-10-CM | POA: Diagnosis not present

## 2014-02-20 DIAGNOSIS — J309 Allergic rhinitis, unspecified: Secondary | ICD-10-CM | POA: Diagnosis not present

## 2014-02-20 DIAGNOSIS — Z0000 Encounter for general adult medical examination without abnormal findings: Secondary | ICD-10-CM | POA: Diagnosis not present

## 2014-02-27 DIAGNOSIS — IMO0001 Reserved for inherently not codable concepts without codable children: Secondary | ICD-10-CM | POA: Diagnosis not present

## 2014-02-27 DIAGNOSIS — E782 Mixed hyperlipidemia: Secondary | ICD-10-CM | POA: Diagnosis not present

## 2014-03-25 DIAGNOSIS — I69998 Other sequelae following unspecified cerebrovascular disease: Secondary | ICD-10-CM | POA: Diagnosis not present

## 2014-04-23 DIAGNOSIS — J209 Acute bronchitis, unspecified: Secondary | ICD-10-CM | POA: Diagnosis not present

## 2014-04-23 DIAGNOSIS — I69998 Other sequelae following unspecified cerebrovascular disease: Secondary | ICD-10-CM | POA: Diagnosis not present

## 2014-04-23 DIAGNOSIS — R42 Dizziness and giddiness: Secondary | ICD-10-CM | POA: Diagnosis not present

## 2014-05-01 DIAGNOSIS — Z131 Encounter for screening for diabetes mellitus: Secondary | ICD-10-CM | POA: Diagnosis not present

## 2014-05-01 DIAGNOSIS — I10 Essential (primary) hypertension: Secondary | ICD-10-CM | POA: Diagnosis not present

## 2014-05-09 DIAGNOSIS — M25569 Pain in unspecified knee: Secondary | ICD-10-CM | POA: Diagnosis not present

## 2014-05-09 DIAGNOSIS — M129 Arthropathy, unspecified: Secondary | ICD-10-CM | POA: Diagnosis not present

## 2014-06-21 NOTE — H&P (Signed)
TOTAL KNEE ADMISSION H&P  Patient is being admitted for left total knee arthroplasty.  Subjective:  Chief Complaint:    Left knee OA / pain.  HPI: Mary Moody, 73 y.o. female, has a history of pain and functional disability in the left knee due to arthritis and has failed non-surgical conservative treatments for greater than 12 weeks to includeNSAID's and/or analgesics, corticosteriod injections, use of assistive devices, activity modification and linament.  Onset of symptoms was gradual, starting 2+ years ago with gradually worsening course since that time. The patient noted no past surgery on the left knee(s).  Patient currently rates pain in the left knee(s) at 10 out of 10 with activity. Patient has worsening of pain with activity and weight bearing, pain that interferes with activities of daily living, pain with passive range of motion, crepitus and joint swelling.  Patient has evidence of periarticular osteophytes and joint space narrowing by imaging studies.  There is no active infection.  Risks, benefits and expectations were discussed with the patient.  Risks including but not limited to the risk of anesthesia, blood clots, nerve damage, blood vessel damage, failure of the prosthesis, infection and up to and including death.  Patient understand the risks, benefits and expectations and wishes to proceed with surgery.   PCP: Neale Burly, MD  D/C Plans:      SNF - [facility]  Post-op Meds:       No Rx given  Tranexamic Acid:      To be given - topically  Decadron:      Is to be given  FYI:     Xarelto post-op  Oxycodone post-op    Patient Active Problem List   Diagnosis Date Noted  . S/P right TH revision 05/23/2013  . Expected blood loss anemia 05/23/2013  . Obese 05/23/2013  . Hyponatremia 05/23/2013   Past Medical History  Diagnosis Date  . COPD (chronic obstructive pulmonary disease)   . Hypertension   . Thyroid disease   . Dislocation of right hip      03/2013   . Diabetes mellitus without complication     type II   . Anxiety   . Incontinence of urine   . Hypothyroidism   . Hyperlipidemia   . H/O hiatal hernia   . Chronic low back pain     Past Surgical History  Procedure Laterality Date  . Abdominal hysterectomy    . Cholecystectomy    . Ankle fracture surgery    . Joint replacement  03/13/2013    right hip replacement  . Back surgery      with plate placement   . Dilation and curettage of uterus    . Total hip revision Right 05/22/2013    Procedure: REVISION RIGHT TOTAL HIP ARTHROPLASTY;  Surgeon: Mauri Pole, MD;  Location: [facility] ORS;  Service: Orthopedics;  Laterality: Right;    No prescriptions prior to admission   Allergies  Allergen Reactions  . Antivert [Meclizine Hcl]   . Codeine   . Erythromycin     History  Substance Use Topics  . Smoking status: Current Every Day Smoker -- 1.00 packs/day    Types: Cigarettes  . Smokeless tobacco: Not on file  . Alcohol Use: No    No family history on file.   Review of Systems  Constitutional: Negative.   HENT: Negative.   Eyes: Negative.   Respiratory: Negative.   Cardiovascular: Negative.   Gastrointestinal: Negative.   Genitourinary: Negative.  Musculoskeletal: Positive for joint pain.  Skin: Negative.   Neurological: Negative.   Endo/Heme/Allergies: Negative.   Psychiatric/Behavioral: The patient is nervous/anxious.     Objective:  Physical Exam  Constitutional: She is oriented to person, place, and time. She appears well-developed and well-nourished.  HENT:  Head: Normocephalic and atraumatic.  Mouth/Throat: Oropharynx is clear and moist.  Eyes: Pupils are equal, round, and reactive to light.  Neck: Neck supple. No JVD present. No tracheal deviation present. No thyromegaly present.  Cardiovascular: Normal rate, regular rhythm, normal heart sounds and intact distal pulses.   Respiratory: Effort normal and breath sounds normal. No respiratory  distress. She has no wheezes.  GI: Soft. There is no tenderness. There is no guarding.  Musculoskeletal:       Left knee: She exhibits decreased range of motion, swelling, abnormal alignment and bony tenderness. She exhibits no ecchymosis, no deformity, no laceration and no erythema. Tenderness found.  Lymphadenopathy:    She has no cervical adenopathy.  Neurological: She is alert and oriented to person, place, and time.  Skin: Skin is warm and dry.  Psychiatric: She has a normal mood and affect.     Labs:  Estimated body mass index is 32.73 kg/(m^2) as calculated from the following:   Height as of 05/22/13: 5\' 2"  (1.575 m).   Weight as of 05/22/13: 81.19 kg (178 lb 15.9 oz).   Imaging Review Plain radiographs demonstrate severe degenerative joint disease of the left knee(s). The overall alignment is significant valgus. The bone quality appears to be good for age and reported activity level.  Assessment/Plan:  End stage arthritis, left knee   The patient history, physical examination, clinical judgment of the provider and imaging studies are consistent with end stage degenerative joint disease of the left knee(s) and total knee arthroplasty is deemed medically necessary. The treatment options including medical management, injection therapy arthroscopy and arthroplasty were discussed at length. The risks and benefits of total knee arthroplasty were presented and reviewed. The risks due to aseptic loosening, infection, stiffness, patella tracking problems, thromboembolic complications and other imponderables were discussed. The patient acknowledged the explanation, agreed to proceed with the plan and consent was signed. Patient is being admitted for inpatient treatment for surgery, pain control, PT, OT, prophylactic antibiotics, VTE prophylaxis, progressive ambulation and ADL's and discharge planning. The patient is planning to be discharged to skilled nursing facility.      [place] Pugh  Parilee Hally   PA-C  06/21/2014, 1:18 PM

## 2014-06-24 DIAGNOSIS — M76899 Other specified enthesopathies of unspecified lower limb, excluding foot: Secondary | ICD-10-CM | POA: Diagnosis not present

## 2014-06-24 DIAGNOSIS — I10 Essential (primary) hypertension: Secondary | ICD-10-CM | POA: Diagnosis not present

## 2014-06-24 DIAGNOSIS — IMO0001 Reserved for inherently not codable concepts without codable children: Secondary | ICD-10-CM | POA: Diagnosis not present

## 2014-06-25 ENCOUNTER — Encounter (HOSPITAL_COMMUNITY): Payer: Self-pay | Admitting: Pharmacy Technician

## 2014-06-26 ENCOUNTER — Ambulatory Visit (HOSPITAL_COMMUNITY)
Admission: RE | Admit: 2014-06-26 | Discharge: 2014-06-26 | Disposition: A | Discharge status: Home / Self Care | Payer: Medicare Other | Source: Ambulatory Visit | Attending: Anesthesiology | Admitting: Anesthesiology

## 2014-06-26 ENCOUNTER — Encounter (HOSPITAL_COMMUNITY)
Admission: RE | Admit: 2014-06-26 | Discharge: 2014-06-26 | Disposition: A | Discharge status: Home / Self Care | Payer: Medicare Other | Source: Ambulatory Visit | Attending: Orthopedic Surgery | Admitting: Orthopedic Surgery

## 2014-06-26 ENCOUNTER — Encounter (HOSPITAL_COMMUNITY): Payer: Self-pay

## 2014-06-26 DIAGNOSIS — M1710 Unilateral primary osteoarthritis, unspecified knee: Secondary | ICD-10-CM | POA: Insufficient documentation

## 2014-06-26 DIAGNOSIS — Z01812 Encounter for preprocedural laboratory examination: Secondary | ICD-10-CM | POA: Diagnosis not present

## 2014-06-26 DIAGNOSIS — Z01818 Encounter for other preprocedural examination: Secondary | ICD-10-CM | POA: Insufficient documentation

## 2014-06-26 DIAGNOSIS — Z01811 Encounter for preprocedural respiratory examination: Secondary | ICD-10-CM | POA: Diagnosis not present

## 2014-06-26 DIAGNOSIS — Z01810 Encounter for preprocedural cardiovascular examination: Secondary | ICD-10-CM | POA: Insufficient documentation

## 2014-06-26 HISTORY — DX: Personal history of other medical treatment: Z92.89

## 2014-06-26 LAB — URINALYSIS, ROUTINE W REFLEX MICROSCOPIC
Bilirubin Urine: NEGATIVE
GLUCOSE, UA: NEGATIVE mg/dL
Hgb urine dipstick: NEGATIVE
KETONES UR: NEGATIVE mg/dL
LEUKOCYTES UA: NEGATIVE
NITRITE: NEGATIVE
Protein, ur: NEGATIVE mg/dL
Specific Gravity, Urine: 1.008 (ref 1.005–1.030)
Urobilinogen, UA: 0.2 mg/dL (ref 0.0–1.0)
pH: 6.5 (ref 5.0–8.0)

## 2014-06-26 LAB — BASIC METABOLIC PANEL
ANION GAP: 13 (ref 5–15)
BUN: 12 mg/dL (ref 6–23)
CHLORIDE: 94 meq/L — AB (ref 96–112)
CO2: 26 meq/L (ref 19–32)
Calcium: 10.4 mg/dL (ref 8.4–10.5)
Creatinine, Ser: 0.77 mg/dL (ref 0.50–1.10)
GFR calc Af Amer: >90 mL/min (ref >90)
GFR calc non Af Amer: 83 mL/min — ABNORMAL LOW (ref >90)
Glucose, Bld: 154 mg/dL — ABNORMAL HIGH (ref 70–99)
Potassium: 4.6 mEq/L (ref 3.7–5.3)
SODIUM: 133 meq/L — AB (ref 137–147)

## 2014-06-26 LAB — CBC
HEMATOCRIT: 36.9 % (ref 36.0–46.0)
HEMOGLOBIN: 13 g/dL (ref 12.0–15.0)
MCH: 30.7 pg (ref 26.0–34.0)
MCHC: 35.2 g/dL (ref 30.0–36.0)
MCV: 87.2 fL (ref 78.0–100.0)
Platelets: 282 10*3/uL (ref 150–400)
RBC: 4.23 MIL/uL (ref 3.87–5.11)
RDW: 12.8 % (ref 11.5–15.5)
WBC: 6.5 10*3/uL (ref 4.0–10.5)

## 2014-06-26 LAB — PROTIME-INR
INR: 1.19 (ref 0.00–1.49)
Prothrombin Time: 15.1 seconds (ref 11.6–15.2)

## 2014-06-26 LAB — APTT: APTT: 38 s — AB (ref 24–37)

## 2014-06-26 LAB — SURGICAL PCR SCREEN
MRSA, PCR: NEGATIVE
Staphylococcus aureus: NEGATIVE

## 2014-06-26 NOTE — Patient Instructions (Addendum)
Mary Moody  06/26/2014   Your procedure is scheduled on:  8-11 -2015 Tuesday at 3:45 PM  Enter through [facility] Entrance and follow signs to [facility]. Arrive at    1230    PM.  Call this number if you have problems the morning of surgery: [phone]  Or Presurgical Testing [phone](Corbyn Wildey) For Living Will and/or Health Care Power Attorney Forms: please provide copy for your medical record,may bring AM of surgery(Forms should be already notarized -we do not provide this service).(06-26-14 Yes/ will provide copy 07-02-14 AM).      Do not eat food:After Midnight.  May have clear liquids:up to 6 Hours before arrival. Nothing after : 0930 AM  Clear liquids include soda, tea, black coffee, apple or grape juice, broth.  Take these medicines the morning of surgery with A SIP OF  WATER: Buspar. Gemfibrozil. Isosorbide. Labetalol. Levothyroxine. Pantoprazole. May take pain med if needed. Take no Diabetic meds AM of.   Do not wear jewelry, make-up or nail polish.  Do not wear lotions, powders, or perfumes. You may wear deodorant.  Do not shave 48 hours(2 days) prior to first CHG shower(legs and under arms).(Shaving face and neck okay.)  Do not bring valuables to the hospital.(Hospital is not responsible for lost valuables).  Contacts, dentures or removable bridgework, body piercing, hair pins may not be worn into surgery.  Leave suitcase in the car. After surgery it may be brought to your room.  For patients admitted to the hospital, checkout time is 11:00 AM the day of discharge.(Restricted visitors-Any Persons displaying flu-like symptoms or illness).    Patients discharged the day of surgery will not be allowed to drive home. Must have responsible person with you x 24 hours once discharged.  Name and phone number of your driver: Loralie Champagne [phone] Special Instructions: CHG(Chlorhedine 4%-"Hibiclens","Betasept","Aplicare") Shower Use Special Wash: see  special instructions.(avoid face and genitals)   Please read over the following fact sheets that you were given: MRSA Information, Blood Transfusion fact sheet, Incentive Spirometry Instruction.  Remember : Type/Screen "Blue armbands" - may not be removed once applied(would result in being retested AM of surgery, if removed).     ____________________    [facility] - Preparing for Surgery Before surgery, you can play an important role.  Because skin is not sterile, your skin needs to be as free of germs as possible.  You can reduce the number of germs on your skin by washing with CHG (chlorahexidine gluconate) soap before surgery.  CHG is an antiseptic cleaner which kills germs and bonds with the skin to continue killing germs even after washing. Please DO NOT use if you have an allergy to CHG or antibacterial soaps.  If your skin becomes reddened/irritated stop using the CHG and inform your nurse when you arrive at Short Stay. Do not shave (including legs and underarms) for at least 48 hours prior to the first CHG shower.  You may shave your face/neck. Please follow these instructions carefully:  1.  Shower with CHG Soap the night before surgery and the  morning of Surgery.  2.  If you choose to wash your hair, wash your hair first as usual with your  normal  shampoo.  3.  After you shampoo, rinse your hair and body thoroughly to remove the  shampoo.  4.  Use CHG as you would any other liquid soap.  You can apply chg directly  to the skin and wash                       Gently with a scrungie or clean washcloth.  5.  Apply the CHG Soap to your body ONLY FROM THE NECK DOWN.   Do not use on face/ open                           Wound or open sores. Avoid contact with eyes, ears mouth and genitals (private parts).                       Wash face,  Genitals (private parts) with your normal soap.             6.  Wash thoroughly, paying special attention to the area where your  surgery  will be performed.  7.  Thoroughly rinse your body with warm water from the neck down.  8.  DO NOT shower/wash with your normal soap after using and rinsing off  the CHG Soap.                9.  Pat yourself dry with a clean towel.            10.  Wear clean pajamas.            11.  Place clean sheets on your bed the night of your first shower and do not  sleep with pets. Day of Surgery : Do not apply any lotions/deodorants the morning of surgery.  Please wear clean clothes to the hospital/surgery center.  FAILURE TO FOLLOW THESE INSTRUCTIONS MAY RESULT IN THE CANCELLATION OF YOUR SURGERY PATIENT SIGNATURE_________________________________  NURSE SIGNATURE__________________________________  ________________________________________________________________________   Mary Moody  An incentive spirometer is a tool that can help keep your lungs clear and active. This tool measures how well you are filling your lungs with each breath. Taking long deep breaths may help reverse or decrease the chance of developing breathing (pulmonary) problems (especially infection) following:  A long period of time when you are unable to move or be active. BEFORE THE PROCEDURE   If the spirometer includes an indicator to show your best effort, your nurse or respiratory therapist will set it to a desired goal.  If possible, sit up straight or lean slightly forward. Try not to slouch.  Hold the incentive spirometer in an upright position. INSTRUCTIONS FOR USE  1. Sit on the edge of your bed if possible, or sit up as far as you can in bed or on a chair. 2. Hold the incentive spirometer in an upright position. 3. Breathe out normally. 4. Place the mouthpiece in your mouth and seal your lips tightly around it. 5. Breathe in slowly and as deeply as possible, raising the piston or the ball toward the top of the column. 6. Hold your breath for 3-5 seconds or for as long as possible. Allow the  piston or ball to fall to the bottom of the column. 7. Remove the mouthpiece from your mouth and breathe out normally. 8. Rest for a few seconds and repeat Steps 1 through 7 at least 10 times every 1-2 hours when you are awake. Take your time and take a few normal breaths between deep breaths. 9. The spirometer may include an indicator to  show your best effort. Use the indicator as a goal to work toward during each repetition. 10. After each set of 10 deep breaths, practice coughing to be sure your lungs are clear. If you have an incision (the cut made at the time of surgery), support your incision when coughing by placing a pillow or rolled up towels firmly against it. Once you are able to get out of bed, walk around indoors and cough well. You may stop using the incentive spirometer when instructed by your caregiver.  RISKS AND COMPLICATIONS  Take your time so you do not get dizzy or light-headed.  If you are in pain, you may need to take or ask for pain medication before doing incentive spirometry. It is harder to take a deep breath if you are having pain. AFTER USE  Rest and breathe slowly and easily.  It can be helpful to keep track of a log of your progress. Your caregiver can provide you with a simple table to help with this. If you are using the spirometer at home, follow these instructions: [place] IF:   You are having difficultly using the spirometer.  You have trouble using the spirometer as often as instructed.  Your pain medication is not giving enough relief while using the spirometer.  You develop fever of 100.5 F (38.1 C) or higher. SEEK IMMEDIATE MEDICAL CARE IF:   You cough up bloody sputum that had not been present before.  You develop fever of 102 F (38.9 C) or greater.  You develop worsening pain at or near the incision site. MAKE SURE YOU:   Understand these instructions.  Will watch your condition.  Will get help right away if you are not  doing well or get worse. Document Released: 03/21/2007 Document Revised: 01/31/2012 Document Reviewed: 05/22/2007 Exit[employer] Patient Information 2014 [employer], [place].   ________________________________________________________________________  WHAT IS A BLOOD TRANSFUSION? Blood Transfusion Information  A transfusion is the replacement of blood or some of its parts. Blood is made up of multiple cells which provide different functions.  Red blood cells carry oxygen and are used for blood loss replacement.  White blood cells fight against infection.  Platelets control bleeding.  Plasma helps clot blood.  Other blood products are available for specialized needs, such as hemophilia or other clotting disorders. BEFORE THE TRANSFUSION  Who gives blood for transfusions?   Healthy volunteers who are fully evaluated to make sure their blood is safe. This is blood bank blood. Transfusion therapy is the safest it has ever been in the practice of medicine. Before blood is taken from a donor, a complete history is taken to make sure that person has no history of diseases nor engages in risky social behavior (examples are intravenous drug use or sexual activity with multiple partners). The donor's travel history is screened to minimize risk of transmitting infections, such as malaria. The donated blood is tested for signs of infectious diseases, such as HIV and hepatitis. The blood is then tested to be sure it is compatible with you in order to minimize the chance of a transfusion reaction. If you or a relative donates blood, this is often done in anticipation of surgery and is not appropriate for emergency situations. It takes many days to process the donated blood. RISKS AND COMPLICATIONS Although transfusion therapy is very safe and saves many lives, the main dangers of transfusion include:   Getting an infectious disease.  Developing a transfusion reaction. This is an allergic reaction to  something  in the blood you were given. Every precaution is taken to prevent this. The decision to have a blood transfusion has been considered carefully by your caregiver before blood is given. Blood is not given unless the benefits outweigh the risks. AFTER THE TRANSFUSION  Right after receiving a blood transfusion, you will usually feel much better and more energetic. This is especially true if your red blood cells have gotten low (anemic). The transfusion raises the level of the red blood cells which carry oxygen, and this usually causes an energy increase.  The nurse administering the transfusion will monitor you carefully for complications. HOME CARE INSTRUCTIONS  No special instructions are needed after a transfusion. You may find your energy is better. Speak with your caregiver about any limitations on activity for underlying diseases you may have. SEEK MEDICAL CARE IF:   Your condition is not improving after your transfusion.  You develop redness or irritation at the intravenous (IV) site. SEEK IMMEDIATE MEDICAL CARE IF:  Any of the following symptoms occur over the next 12 hours:  Shaking chills.  You have a temperature by mouth above 102 F (38.9 C), not controlled by medicine.  Chest, back, or muscle pain.  People around you feel you are not acting correctly or are confused.  Shortness of breath or difficulty breathing.  Dizziness and fainting.  You get a rash or develop hives.  You have a decrease in urine output.  Your urine turns a dark color or changes to pink, red, or brown. Any of the following symptoms occur over the next 10 days:  You have a temperature by mouth above 102 F (38.9 C), not controlled by medicine.  Shortness of breath.  Weakness after normal activity.  The white part of the eye turns yellow (jaundice).  You have a decrease in the amount of urine or are urinating less often.  Your urine turns a dark color or changes to pink, red, or  brown. Document Released: 11/05/2000 Document Revised: 01/31/2012 Document Reviewed: 06/24/2008 [facility] Patient Information 2014 [place], [place].  _______________________________________________________________________

## 2014-06-26 NOTE — Pre-Procedure Instructions (Addendum)
06-26-14 Clearance note(05-17-14) Dr. Sherrie Sport - with chart . EKG/ CXR done today.

## 2014-07-02 ENCOUNTER — Encounter (HOSPITAL_COMMUNITY)
Admission: RE | Disposition: A | Discharge status: Skilled Nursing Facility | Payer: Self-pay | Source: Ambulatory Visit | Attending: Orthopedic Surgery

## 2014-07-02 ENCOUNTER — Encounter (HOSPITAL_COMMUNITY): Payer: Self-pay | Admitting: *Deleted

## 2014-07-02 ENCOUNTER — Encounter (HOSPITAL_COMMUNITY): Payer: Medicare Other | Admitting: Anesthesiology

## 2014-07-02 ENCOUNTER — Inpatient Hospital Stay (HOSPITAL_COMMUNITY)
Admission: RE | Admit: 2014-07-02 | Discharge: 2014-07-04 | DRG: 470 | Disposition: A | Discharge status: Skilled Nursing Facility | Payer: Medicare Other | Source: Ambulatory Visit | Attending: Orthopedic Surgery | Admitting: Orthopedic Surgery

## 2014-07-02 ENCOUNTER — Inpatient Hospital Stay (HOSPITAL_COMMUNITY): Payer: Medicare Other | Admitting: Anesthesiology

## 2014-07-02 DIAGNOSIS — F17200 Nicotine dependence, unspecified, uncomplicated: Secondary | ICD-10-CM | POA: Diagnosis present

## 2014-07-02 DIAGNOSIS — S99929A Unspecified injury of unspecified foot, initial encounter: Secondary | ICD-10-CM | POA: Diagnosis not present

## 2014-07-02 DIAGNOSIS — D500 Iron deficiency anemia secondary to blood loss (chronic): Secondary | ICD-10-CM | POA: Diagnosis present

## 2014-07-02 DIAGNOSIS — Z96652 Presence of left artificial knee joint: Secondary | ICD-10-CM

## 2014-07-02 DIAGNOSIS — M1710 Unilateral primary osteoarthritis, unspecified knee: Principal | ICD-10-CM | POA: Diagnosis present

## 2014-07-02 DIAGNOSIS — J449 Chronic obstructive pulmonary disease, unspecified: Secondary | ICD-10-CM | POA: Diagnosis not present

## 2014-07-02 DIAGNOSIS — I10 Essential (primary) hypertension: Secondary | ICD-10-CM | POA: Diagnosis present

## 2014-07-02 DIAGNOSIS — D649 Anemia, unspecified: Secondary | ICD-10-CM | POA: Diagnosis not present

## 2014-07-02 DIAGNOSIS — M159 Polyosteoarthritis, unspecified: Secondary | ICD-10-CM | POA: Diagnosis not present

## 2014-07-02 DIAGNOSIS — Z96649 Presence of unspecified artificial hip joint: Secondary | ICD-10-CM

## 2014-07-02 DIAGNOSIS — Z6832 Body mass index (BMI) 32.0-32.9, adult: Secondary | ICD-10-CM

## 2014-07-02 DIAGNOSIS — D62 Acute posthemorrhagic anemia: Secondary | ICD-10-CM | POA: Diagnosis not present

## 2014-07-02 DIAGNOSIS — K449 Diaphragmatic hernia without obstruction or gangrene: Secondary | ICD-10-CM | POA: Diagnosis present

## 2014-07-02 DIAGNOSIS — E785 Hyperlipidemia, unspecified: Secondary | ICD-10-CM | POA: Diagnosis present

## 2014-07-02 DIAGNOSIS — R262 Difficulty in walking, not elsewhere classified: Secondary | ICD-10-CM | POA: Diagnosis not present

## 2014-07-02 DIAGNOSIS — J4489 Other specified chronic obstructive pulmonary disease: Secondary | ICD-10-CM | POA: Diagnosis present

## 2014-07-02 DIAGNOSIS — M6580 Other synovitis and tenosynovitis, unspecified site: Secondary | ICD-10-CM | POA: Diagnosis present

## 2014-07-02 DIAGNOSIS — E119 Type 2 diabetes mellitus without complications: Secondary | ICD-10-CM | POA: Diagnosis present

## 2014-07-02 DIAGNOSIS — R279 Unspecified lack of coordination: Secondary | ICD-10-CM | POA: Diagnosis not present

## 2014-07-02 DIAGNOSIS — Z471 Aftercare following joint replacement surgery: Secondary | ICD-10-CM | POA: Diagnosis not present

## 2014-07-02 DIAGNOSIS — M6281 Muscle weakness (generalized): Secondary | ICD-10-CM | POA: Diagnosis not present

## 2014-07-02 DIAGNOSIS — E039 Hypothyroidism, unspecified: Secondary | ICD-10-CM | POA: Diagnosis present

## 2014-07-02 DIAGNOSIS — Z96659 Presence of unspecified artificial knee joint: Secondary | ICD-10-CM | POA: Diagnosis not present

## 2014-07-02 DIAGNOSIS — S8990XA Unspecified injury of unspecified lower leg, initial encounter: Secondary | ICD-10-CM | POA: Diagnosis not present

## 2014-07-02 DIAGNOSIS — E669 Obesity, unspecified: Secondary | ICD-10-CM | POA: Diagnosis present

## 2014-07-02 DIAGNOSIS — M25569 Pain in unspecified knee: Secondary | ICD-10-CM | POA: Diagnosis not present

## 2014-07-02 DIAGNOSIS — M549 Dorsalgia, unspecified: Secondary | ICD-10-CM | POA: Diagnosis not present

## 2014-07-02 HISTORY — PX: TOTAL KNEE ARTHROPLASTY: SHX125

## 2014-07-02 LAB — TYPE AND SCREEN
ABO/RH(D): O NEG
Antibody Screen: NEGATIVE

## 2014-07-02 LAB — GLUCOSE, CAPILLARY
GLUCOSE-CAPILLARY: 206 mg/dL — AB (ref 70–99)
Glucose-Capillary: 137 mg/dL — ABNORMAL HIGH (ref 70–99)
Glucose-Capillary: 157 mg/dL — ABNORMAL HIGH (ref 70–99)

## 2014-07-02 SURGERY — ARTHROPLASTY, KNEE, TOTAL
Anesthesia: Spinal | Site: Knee | Laterality: Left

## 2014-07-02 MED ORDER — HYDROMORPHONE HCL PF 1 MG/ML IJ SOLN
INTRAMUSCULAR | Status: AC
  Filled 2014-07-02: qty 1

## 2014-07-02 MED ORDER — DEXAMETHASONE SODIUM PHOSPHATE 10 MG/ML IJ SOLN
INTRAMUSCULAR | Status: DC | PRN
  Administered 2014-07-02: 10 mg via INTRAVENOUS

## 2014-07-02 MED ORDER — LABETALOL HCL 200 MG PO TABS
200.0000 mg | ORAL_TABLET | Freq: Every morning | ORAL | Status: DC
  Administered 2014-07-03 – 2014-07-04 (×2): 200 mg via ORAL
  Filled 2014-07-02 (×2): qty 1

## 2014-07-02 MED ORDER — DIPHENHYDRAMINE HCL 25 MG PO CAPS: 25.0000 mg | ORAL_CAPSULE | Freq: Four times a day (QID) | ORAL | Status: DC | PRN

## 2014-07-02 MED ORDER — CEFAZOLIN SODIUM-DEXTROSE 2-3 GM-% IV SOLR
INTRAVENOUS | Status: AC
  Filled 2014-07-02: qty 50

## 2014-07-02 MED ORDER — FENTANYL CITRATE 0.05 MG/ML IJ SOLN
INTRAMUSCULAR | Status: AC
  Filled 2014-07-02: qty 2

## 2014-07-02 MED ORDER — TRANEXAMIC ACID 100 MG/ML IV SOLN: 2000.0000 mg | INTRAVENOUS | Status: DC

## 2014-07-02 MED ORDER — KETOROLAC TROMETHAMINE 30 MG/ML IJ SOLN
INTRAMUSCULAR | Status: DC | PRN
  Administered 2014-07-02: 30 mg

## 2014-07-02 MED ORDER — KETOROLAC TROMETHAMINE 30 MG/ML IJ SOLN
INTRAMUSCULAR | Status: AC
  Filled 2014-07-02: qty 1

## 2014-07-02 MED ORDER — DEXAMETHASONE SODIUM PHOSPHATE 10 MG/ML IJ SOLN
INTRAMUSCULAR | Status: AC
  Filled 2014-07-02: qty 1

## 2014-07-02 MED ORDER — ONDANSETRON HCL 4 MG/2ML IJ SOLN
INTRAMUSCULAR | Status: AC
  Filled 2014-07-02: qty 2

## 2014-07-02 MED ORDER — ACETAMINOPHEN 10 MG/ML IV SOLN
INTRAVENOUS | Status: DC | PRN
  Administered 2014-07-02: 1000 mg via INTRAVENOUS

## 2014-07-02 MED ORDER — ALUM & MAG HYDROXIDE-SIMETH 200-200-20 MG/5ML PO SUSP
30.0000 mL | ORAL | Status: DC | PRN
  Administered 2014-07-03: 30 mL via ORAL
  Filled 2014-07-02: qty 30

## 2014-07-02 MED ORDER — HYDROMORPHONE HCL PF 1 MG/ML IJ SOLN
0.2500 mg | INTRAMUSCULAR | Status: DC | PRN
  Administered 2014-07-02 (×2): 0.5 mg via INTRAVENOUS

## 2014-07-02 MED ORDER — TRANEXAMIC ACID 100 MG/ML IV SOLN
2000.0000 mg | Freq: Once | INTRAVENOUS | Status: DC
  Filled 2014-07-02: qty 20

## 2014-07-02 MED ORDER — LEVOTHYROXINE SODIUM 100 MCG PO TABS
100.0000 ug | ORAL_TABLET | Freq: Every day | ORAL | Status: DC
  Administered 2014-07-03 – 2014-07-04 (×2): 100 ug via ORAL
  Filled 2014-07-02 (×3): qty 1

## 2014-07-02 MED ORDER — FERROUS SULFATE 325 (65 FE) MG PO TABS
325.0000 mg | ORAL_TABLET | Freq: Three times a day (TID) | ORAL | Status: DC
  Administered 2014-07-03 – 2014-07-04 (×4): 325 mg via ORAL
  Filled 2014-07-02 (×7): qty 1

## 2014-07-02 MED ORDER — DARIFENACIN HYDROBROMIDE ER 15 MG PO TB24
15.0000 mg | ORAL_TABLET | Freq: Every day | ORAL | Status: DC
  Administered 2014-07-02 – 2014-07-04 (×3): 15 mg via ORAL
  Filled 2014-07-02 (×3): qty 1

## 2014-07-02 MED ORDER — BUPIVACAINE LIPOSOME 1.3 % IJ SUSP
20.0000 mL | Freq: Once | INTRAMUSCULAR | Status: AC
  Administered 2014-07-02: 20 mL
  Filled 2014-07-02: qty 20

## 2014-07-02 MED ORDER — SODIUM CHLORIDE 0.9 % IJ SOLN
INTRAMUSCULAR | Status: AC
  Filled 2014-07-02: qty 50

## 2014-07-02 MED ORDER — CEFAZOLIN SODIUM-DEXTROSE 2-3 GM-% IV SOLR
2.0000 g | INTRAVENOUS | Status: AC
  Administered 2014-07-02: 2 g via INTRAVENOUS

## 2014-07-02 MED ORDER — DOCUSATE SODIUM 100 MG PO CAPS
100.0000 mg | ORAL_CAPSULE | Freq: Two times a day (BID) | ORAL | Status: DC
  Administered 2014-07-02 – 2014-07-04 (×3): 100 mg via ORAL

## 2014-07-02 MED ORDER — LACTATED RINGERS IV SOLN
INTRAVENOUS | Status: DC | PRN
  Administered 2014-07-02 (×3): via INTRAVENOUS

## 2014-07-02 MED ORDER — FENTANYL CITRATE 0.05 MG/ML IJ SOLN
INTRAMUSCULAR | Status: AC
  Filled 2014-07-02: qty 5

## 2014-07-02 MED ORDER — MIDAZOLAM HCL 5 MG/5ML IJ SOLN
INTRAMUSCULAR | Status: DC | PRN
  Administered 2014-07-02 (×2): 1 mg via INTRAVENOUS

## 2014-07-02 MED ORDER — MAGNESIUM CITRATE PO SOLN: 1.0000 | Freq: Once | ORAL | Status: AC | PRN

## 2014-07-02 MED ORDER — SIMVASTATIN 20 MG PO TABS
20.0000 mg | ORAL_TABLET | Freq: Every day | ORAL | Status: DC
  Administered 2014-07-02 – 2014-07-04 (×3): 20 mg via ORAL
  Filled 2014-07-02 (×3): qty 1

## 2014-07-02 MED ORDER — METOCLOPRAMIDE HCL 5 MG/ML IJ SOLN: 5.0000 mg | Freq: Three times a day (TID) | INTRAMUSCULAR | Status: DC | PRN

## 2014-07-02 MED ORDER — ISOSORBIDE MONONITRATE ER 30 MG PO TB24
30.0000 mg | ORAL_TABLET | Freq: Every morning | ORAL | Status: DC
  Administered 2014-07-03 – 2014-07-04 (×2): 30 mg via ORAL
  Filled 2014-07-02 (×2): qty 1

## 2014-07-02 MED ORDER — MENTHOL 3 MG MT LOZG
1.0000 | LOZENGE | OROMUCOSAL | Status: DC | PRN
  Filled 2014-07-02: qty 9

## 2014-07-02 MED ORDER — BUPIVACAINE-EPINEPHRINE (PF) 0.25% -1:200000 IJ SOLN
INTRAMUSCULAR | Status: AC
  Filled 2014-07-02: qty 30

## 2014-07-02 MED ORDER — CEFAZOLIN SODIUM-DEXTROSE 2-3 GM-% IV SOLR
2.0000 g | Freq: Four times a day (QID) | INTRAVENOUS | Status: AC
  Administered 2014-07-02 – 2014-07-03 (×2): 2 g via INTRAVENOUS
  Filled 2014-07-02 (×2): qty 50

## 2014-07-02 MED ORDER — ASPIRIN EC 325 MG PO TBEC
325.0000 mg | DELAYED_RELEASE_TABLET | Freq: Two times a day (BID) | ORAL | Status: DC
  Administered 2014-07-03 – 2014-07-04 (×3): 325 mg via ORAL
  Filled 2014-07-02 (×5): qty 1

## 2014-07-02 MED ORDER — PHENOL 1.4 % MT LIQD
1.0000 | OROMUCOSAL | Status: DC | PRN
  Filled 2014-07-02: qty 177

## 2014-07-02 MED ORDER — MIDAZOLAM HCL 2 MG/2ML IJ SOLN
INTRAMUSCULAR | Status: AC
  Filled 2014-07-02: qty 2

## 2014-07-02 MED ORDER — EPHEDRINE SULFATE 50 MG/ML IJ SOLN
INTRAMUSCULAR | Status: AC
  Filled 2014-07-02: qty 1

## 2014-07-02 MED ORDER — DEXAMETHASONE SODIUM PHOSPHATE 10 MG/ML IJ SOLN: 10.0000 mg | Freq: Once | INTRAMUSCULAR | Status: DC

## 2014-07-02 MED ORDER — ONDANSETRON HCL 4 MG/2ML IJ SOLN
INTRAMUSCULAR | Status: DC | PRN
  Administered 2014-07-02 (×2): 2 mg via INTRAVENOUS

## 2014-07-02 MED ORDER — SODIUM CHLORIDE 0.9 % IR SOLN
Status: DC | PRN
  Administered 2014-07-02: 1000 mL

## 2014-07-02 MED ORDER — POLYETHYLENE GLYCOL 3350 17 G PO PACK
17.0000 g | PACK | Freq: Two times a day (BID) | ORAL | Status: DC
  Administered 2014-07-02 – 2014-07-03 (×2): 17 g via ORAL

## 2014-07-02 MED ORDER — INSULIN ASPART 100 UNIT/ML ~~LOC~~ SOLN
0.0000 [IU] | Freq: Three times a day (TID) | SUBCUTANEOUS | Status: DC
  Administered 2014-07-03: 3 [IU] via SUBCUTANEOUS
  Administered 2014-07-03: 1 [IU] via SUBCUTANEOUS
  Administered 2014-07-04: 2 [IU] via SUBCUTANEOUS

## 2014-07-02 MED ORDER — FENTANYL CITRATE 0.05 MG/ML IJ SOLN
INTRAMUSCULAR | Status: DC | PRN
  Administered 2014-07-02 (×2): 50 ug via INTRAVENOUS
  Administered 2014-07-02: 25 ug via INTRAVENOUS
  Administered 2014-07-02 (×2): 50 ug via INTRAVENOUS
  Administered 2014-07-02: 100 ug via INTRAVENOUS
  Administered 2014-07-02 (×2): 50 ug via INTRAVENOUS
  Administered 2014-07-02: 25 ug via INTRAVENOUS
  Administered 2014-07-02: 100 ug via INTRAVENOUS

## 2014-07-02 MED ORDER — METHOCARBAMOL 1000 MG/10ML IJ SOLN
500.0000 mg | Freq: Four times a day (QID) | INTRAVENOUS | Status: DC | PRN
  Administered 2014-07-02: 500 mg via INTRAVENOUS
  Filled 2014-07-02: qty 5

## 2014-07-02 MED ORDER — EPHEDRINE SULFATE 50 MG/ML IJ SOLN
INTRAMUSCULAR | Status: DC | PRN
  Administered 2014-07-02: 10 mg via INTRAVENOUS

## 2014-07-02 MED ORDER — CELECOXIB 200 MG PO CAPS
200.0000 mg | ORAL_CAPSULE | Freq: Two times a day (BID) | ORAL | Status: DC
  Administered 2014-07-02 – 2014-07-04 (×3): 200 mg via ORAL
  Filled 2014-07-02 (×5): qty 1

## 2014-07-02 MED ORDER — OXYCODONE HCL 5 MG PO TABS
5.0000 mg | ORAL_TABLET | ORAL | Status: DC
  Administered 2014-07-02: 10 mg via ORAL
  Administered 2014-07-03 – 2014-07-04 (×10): 15 mg via ORAL
  Filled 2014-07-02 (×4): qty 3
  Filled 2014-07-02: qty 2
  Filled 2014-07-02 (×6): qty 3

## 2014-07-02 MED ORDER — SODIUM CHLORIDE 0.9 % IV SOLN
INTRAVENOUS | Status: DC
  Administered 2014-07-02: 22:00:00 via INTRAVENOUS
  Filled 2014-07-02 (×5): qty 1000

## 2014-07-02 MED ORDER — METOCLOPRAMIDE HCL 5 MG/ML IJ SOLN
INTRAMUSCULAR | Status: DC | PRN
  Administered 2014-07-02: 5 mg via INTRAVENOUS

## 2014-07-02 MED ORDER — ATROPINE SULFATE 0.4 MG/ML IJ SOLN
INTRAMUSCULAR | Status: AC
  Filled 2014-07-02: qty 1

## 2014-07-02 MED ORDER — CHLORHEXIDINE GLUCONATE 4 % EX LIQD: 60.0000 mL | Freq: Once | CUTANEOUS | Status: DC

## 2014-07-02 MED ORDER — BUSPIRONE HCL 15 MG PO TABS
30.0000 mg | ORAL_TABLET | Freq: Two times a day (BID) | ORAL | Status: DC
  Administered 2014-07-02 – 2014-07-04 (×4): 30 mg via ORAL
  Filled 2014-07-02 (×5): qty 2

## 2014-07-02 MED ORDER — DEXAMETHASONE SODIUM PHOSPHATE 10 MG/ML IJ SOLN
10.0000 mg | Freq: Once | INTRAMUSCULAR | Status: AC
  Administered 2014-07-03: 10 mg via INTRAVENOUS
  Filled 2014-07-02: qty 1

## 2014-07-02 MED ORDER — HYDROMORPHONE HCL PF 1 MG/ML IJ SOLN
0.5000 mg | INTRAMUSCULAR | Status: DC | PRN
  Administered 2014-07-02: 2 mg via INTRAVENOUS
  Filled 2014-07-02: qty 2

## 2014-07-02 MED ORDER — GEMFIBROZIL 600 MG PO TABS
600.0000 mg | ORAL_TABLET | Freq: Two times a day (BID) | ORAL | Status: DC
  Administered 2014-07-03 – 2014-07-04 (×3): 600 mg via ORAL
  Filled 2014-07-02 (×5): qty 1

## 2014-07-02 MED ORDER — HYDRALAZINE HCL 20 MG/ML IJ SOLN
INTRAMUSCULAR | Status: DC | PRN
  Administered 2014-07-02: 5 mg via INTRAVENOUS

## 2014-07-02 MED ORDER — OXYBUTYNIN CHLORIDE 5 MG PO TABS
5.0000 mg | ORAL_TABLET | Freq: Every day | ORAL | Status: DC
  Administered 2014-07-02 – 2014-07-04 (×3): 5 mg via ORAL
  Filled 2014-07-02 (×3): qty 1

## 2014-07-02 MED ORDER — PANTOPRAZOLE SODIUM 40 MG PO TBEC
40.0000 mg | DELAYED_RELEASE_TABLET | Freq: Every day | ORAL | Status: DC
  Administered 2014-07-03 – 2014-07-04 (×2): 40 mg via ORAL
  Filled 2014-07-02 (×2): qty 1

## 2014-07-02 MED ORDER — HYDROMORPHONE HCL PF 1 MG/ML IJ SOLN
0.5000 mg | INTRAMUSCULAR | Status: DC | PRN
  Administered 2014-07-02 (×2): 0.5 mg via INTRAVENOUS

## 2014-07-02 MED ORDER — ONDANSETRON HCL 4 MG/2ML IJ SOLN: 4.0000 mg | Freq: Four times a day (QID) | INTRAMUSCULAR | Status: DC | PRN

## 2014-07-02 MED ORDER — ACETAMINOPHEN 10 MG/ML IV SOLN
1000.0000 mg | Freq: Once | INTRAVENOUS | Status: DC
  Filled 2014-07-02: qty 100

## 2014-07-02 MED ORDER — PROPOFOL 10 MG/ML IV BOLUS
INTRAVENOUS | Status: DC | PRN
  Administered 2014-07-02: 150 mg via INTRAVENOUS

## 2014-07-02 MED ORDER — BUPIVACAINE-EPINEPHRINE (PF) 0.25% -1:200000 IJ SOLN
INTRAMUSCULAR | Status: DC | PRN
  Administered 2014-07-02: 30 mL

## 2014-07-02 MED ORDER — LIDOCAINE HCL (CARDIAC) 20 MG/ML IV SOLN
INTRAVENOUS | Status: AC
  Filled 2014-07-02: qty 5

## 2014-07-02 MED ORDER — OCUVITE PO TABS
1.0000 | ORAL_TABLET | Freq: Every day | ORAL | Status: DC
  Administered 2014-07-02 – 2014-07-04 (×3): 1 via ORAL
  Filled 2014-07-02 (×3): qty 1

## 2014-07-02 MED ORDER — 0.9 % SODIUM CHLORIDE (POUR BTL) OPTIME
TOPICAL | Status: DC | PRN
  Administered 2014-07-02: 1000 mL

## 2014-07-02 MED ORDER — GLIMEPIRIDE 2 MG PO TABS
2.0000 mg | ORAL_TABLET | Freq: Every day | ORAL | Status: DC
  Administered 2014-07-03 – 2014-07-04 (×2): 2 mg via ORAL
  Filled 2014-07-02 (×3): qty 1

## 2014-07-02 MED ORDER — TRANEXAMIC ACID 100 MG/ML IV SOLN
2000.0000 mg | INTRAVENOUS | Status: DC | PRN
  Administered 2014-07-02: 2000 mg via INTRAVENOUS

## 2014-07-02 MED ORDER — METOCLOPRAMIDE HCL 10 MG PO TABS: 5.0000 mg | ORAL_TABLET | Freq: Three times a day (TID) | ORAL | Status: DC | PRN

## 2014-07-02 MED ORDER — PROPOFOL 10 MG/ML IV BOLUS
INTRAVENOUS | Status: AC
  Filled 2014-07-02: qty 40

## 2014-07-02 MED ORDER — ONDANSETRON HCL 4 MG PO TABS: 4.0000 mg | ORAL_TABLET | Freq: Four times a day (QID) | ORAL | Status: DC | PRN

## 2014-07-02 MED ORDER — PROPOFOL 10 MG/ML IV BOLUS
INTRAVENOUS | Status: AC
  Filled 2014-07-02: qty 20

## 2014-07-02 MED ORDER — HYDROCHLOROTHIAZIDE 25 MG PO TABS
25.0000 mg | ORAL_TABLET | Freq: Every morning | ORAL | Status: DC
  Administered 2014-07-03 – 2014-07-04 (×2): 25 mg via ORAL
  Filled 2014-07-02 (×2): qty 1

## 2014-07-02 MED ORDER — SODIUM CHLORIDE 0.9 % IJ SOLN
INTRAMUSCULAR | Status: AC
  Filled 2014-07-02: qty 10

## 2014-07-02 MED ORDER — METHOCARBAMOL 500 MG PO TABS
500.0000 mg | ORAL_TABLET | Freq: Four times a day (QID) | ORAL | Status: DC | PRN
  Administered 2014-07-03 – 2014-07-04 (×5): 500 mg via ORAL
  Filled 2014-07-02 (×5): qty 1

## 2014-07-02 MED ORDER — SODIUM CHLORIDE 0.9 % IJ SOLN
INTRAMUSCULAR | Status: DC | PRN
  Administered 2014-07-02: 9 mL

## 2014-07-02 MED ORDER — PROMETHAZINE HCL 25 MG/ML IJ SOLN: 6.2500 mg | INTRAMUSCULAR | Status: DC | PRN

## 2014-07-02 MED ORDER — LACTATED RINGERS IV SOLN
INTRAVENOUS | Status: DC
  Administered 2014-07-02: 1000 mL via INTRAVENOUS

## 2014-07-02 MED ORDER — BISACODYL 10 MG RE SUPP: 10.0000 mg | Freq: Every day | RECTAL | Status: DC | PRN

## 2014-07-02 SURGICAL SUPPLY — 51 items
BAG ZIPLOCK 12X15 (MISCELLANEOUS) IMPLANT
BANDAGE ELASTIC 6 VELCRO ST LF (GAUZE/BANDAGES/DRESSINGS) ×3 IMPLANT
BANDAGE ESMARK 6X9 LF (GAUZE/BANDAGES/DRESSINGS) ×1 IMPLANT
BLADE SAW SGTL 13.0X1.19X90.0M (BLADE) ×3 IMPLANT
BNDG ESMARK 6X9 LF (GAUZE/BANDAGES/DRESSINGS) ×3
BONE CEMENT GENTAMICIN (Cement) ×9 IMPLANT
BOWL SMART MIX CTS (DISPOSABLE) ×3 IMPLANT
CAPT RP KNEE ×3 IMPLANT
CEMENT BONE GENTAMICIN 40 (Cement) ×3 IMPLANT
CUFF TOURN SGL QUICK 34 (TOURNIQUET CUFF) ×2
CUFF TRNQT CYL 34X4X40X1 (TOURNIQUET CUFF) ×1 IMPLANT
DERMABOND ADVANCED (GAUZE/BANDAGES/DRESSINGS) ×2
DERMABOND ADVANCED .7 DNX12 (GAUZE/BANDAGES/DRESSINGS) ×1 IMPLANT
DRAPE EXTREMITY T 121X128X90 (DRAPE) ×3 IMPLANT
DRAPE POUCH INSTRU U-SHP 10X18 (DRAPES) ×3 IMPLANT
DRAPE U-SHAPE 47X51 STRL (DRAPES) ×3 IMPLANT
DRSG AQUACEL AG ADV 3.5X10 (GAUZE/BANDAGES/DRESSINGS) ×3 IMPLANT
DURAPREP 26ML APPLICATOR (WOUND CARE) ×6 IMPLANT
ELECT REM PT RETURN 9FT ADLT (ELECTROSURGICAL) ×3
ELECTRODE REM PT RTRN 9FT ADLT (ELECTROSURGICAL) ×1 IMPLANT
FACESHIELD WRAPAROUND (MASK) ×15 IMPLANT
GLOVE BIOGEL PI IND STRL 7.5 (GLOVE) ×1 IMPLANT
GLOVE BIOGEL PI IND STRL 8 (GLOVE) ×1 IMPLANT
GLOVE BIOGEL PI INDICATOR 7.5 (GLOVE) ×2
GLOVE BIOGEL PI INDICATOR 8 (GLOVE) ×2
GLOVE ECLIPSE 8.0 STRL XLNG CF (GLOVE) ×3 IMPLANT
GLOVE ORTHO TXT STRL SZ7.5 (GLOVE) ×6 IMPLANT
GOWN SPEC L3 XXLG W/TWL (GOWN DISPOSABLE) ×3 IMPLANT
GOWN STRL REUS W/TWL LRG LVL3 (GOWN DISPOSABLE) ×3 IMPLANT
HANDPIECE INTERPULSE COAX TIP (DISPOSABLE) ×2
KIT BASIN OR (CUSTOM PROCEDURE TRAY) ×3 IMPLANT
MANIFOLD NEPTUNE II (INSTRUMENTS) ×3 IMPLANT
NDL SAFETY ECLIPSE 18X1.5 (NEEDLE) ×1 IMPLANT
NEEDLE HYPO 18GX1.5 SHARP (NEEDLE) ×2
PACK TOTAL JOINT (CUSTOM PROCEDURE TRAY) ×3 IMPLANT
PENCIL BUTTON HOLSTER BLD 10FT (ELECTRODE) ×3 IMPLANT
POSITIONER SURGICAL ARM (MISCELLANEOUS) ×3 IMPLANT
SET HNDPC FAN SPRY TIP SCT (DISPOSABLE) ×1 IMPLANT
SET PAD KNEE POSITIONER (MISCELLANEOUS) ×3 IMPLANT
SUCTION FRAZIER 12FR DISP (SUCTIONS) ×3 IMPLANT
SUT MNCRL AB 4-0 PS2 18 (SUTURE) ×3 IMPLANT
SUT VIC AB 1 CT1 36 (SUTURE) ×3 IMPLANT
SUT VIC AB 2-0 CT1 27 (SUTURE) ×6
SUT VIC AB 2-0 CT1 TAPERPNT 27 (SUTURE) ×3 IMPLANT
SUT VLOC 180 0 24IN GS25 (SUTURE) ×3 IMPLANT
SYRINGE 60CC LL (MISCELLANEOUS) ×3 IMPLANT
TOWEL OR 17X26 10 PK STRL BLUE (TOWEL DISPOSABLE) ×3 IMPLANT
TOWEL OR NON WOVEN STRL DISP B (DISPOSABLE) IMPLANT
TRAY FOLEY CATH 14FRSI W/METER (CATHETERS) ×3 IMPLANT
WATER STERILE IRR 1500ML POUR (IV SOLUTION) ×3 IMPLANT
WRAP KNEE MAXI GEL POST OP (GAUZE/BANDAGES/DRESSINGS) ×3 IMPLANT

## 2014-07-02 NOTE — Interval H&P Note (Signed)
History and Physical Interval Note:  07/02/2014 1:24 PM  Mary Moody  has presented today for surgery, with the diagnosis of left knee osteoarthritis  The various methods of treatment have been discussed with the patient and family. After consideration of risks, benefits and other options for treatment, the patient has consented to  Procedure(s): LEFT TOTAL KNEE ARTHROPLASTY (Left) as a surgical intervention .  The patient's history has been reviewed, patient examined, no change in status, stable for surgery.  I have reviewed the patient's chart and labs.  Questions were answered to the patient's satisfaction.     Mauri Pole

## 2014-07-02 NOTE — Anesthesia Preprocedure Evaluation (Addendum)
Anesthesia Evaluation  Patient identified by MRN, date of birth, ID band Patient awake    Reviewed: Allergy & Precautions, H&P , NPO status , Patient's Chart, lab work & pertinent test results  Airway Mallampati: I TM Distance: >3 FB Neck ROM: Full    Dental  (+) Edentulous Upper, Edentulous Lower   Pulmonary COPDCurrent Smoker,  breath sounds clear to auscultation        Cardiovascular hypertension, Rhythm:Regular Rate:Normal     Neuro/Psych Anxiety negative neurological ROS     GI/Hepatic Neg liver ROS, hiatal hernia,   Endo/Other  diabetesHypothyroidism Morbid obesity  Renal/GU negative Renal ROS     Musculoskeletal negative musculoskeletal ROS (+)   Abdominal   Peds  Hematology negative hematology ROS (+)   Anesthesia Other Findings   Reproductive/Obstetrics negative OB ROS                         Anesthesia Physical Anesthesia Plan  ASA: III  Anesthesia Plan: Spinal   Post-op Pain Management:    Induction: Intravenous  Airway Management Planned: Simple Face Mask  Additional Equipment: None  Intra-op Plan:   Post-operative Plan:   Informed Consent: I have reviewed the patients History and Physical, chart, labs and discussed the procedure including the risks, benefits and alternatives for the proposed anesthesia with the patient or authorized representative who has indicated his/her understanding and acceptance.   Dental advisory given  Plan Discussed with: CRNA, Anesthesiologist and Surgeon  Anesthesia Plan Comments:         Anesthesia Quick Evaluation

## 2014-07-02 NOTE — Anesthesia Postprocedure Evaluation (Signed)
  Anesthesia Post-op Note  Patient: Mary Moody  Procedure(s) Performed: Procedure(s): LEFT TOTAL KNEE ARTHROPLASTY (Left)  Patient Location: PACU  Anesthesia Type:General  Level of Consciousness: alert , oriented and sedated  Airway and Oxygen Therapy: Patient Spontanous Breathing  Post-op Pain: moderate.   Post-op Assessment: Post-op Vital signs reviewed. Post-op analgesia limited by sedation. Stable for discharge to floor.  Post-op Vital Signs: Reviewed  Last Vitals:  Filed Vitals:   07/02/14 2000  BP: 178/85  Pulse: 78  Temp: 37 C  Resp: 11    Complications: No apparent anesthesia complications

## 2014-07-02 NOTE — Op Note (Signed)
NAME:  Mary Moody                      MEDICAL RECORD NO.:  [id number]                             FACILITY:  [facility]      PHYSICIAN:  Pietro Cassis. Alvan Dame, M.D.  DATE OF BIRTH:  Aug 14, 1941      DATE OF PROCEDURE:  07/02/2014                                     OPERATIVE REPORT         PREOPERATIVE DIAGNOSIS:  Left knee osteoarthritis.      POSTOPERATIVE DIAGNOSIS:  Left knee osteoarthritis.      FINDINGS:  The patient was noted to have complete loss of cartilage and   bone-on-bone arthritis with associated osteophytes in the lateral and patellofemoral compartments of   the knee with a significant synovitis and associated effusion.      PROCEDURE:  Left total knee replacement.      COMPONENTS USED:  DePuy rotating platform posterior stabilized knee   system, a size 2.5 femur, 2.5 tibia, 10 mm PS insert, and 32 patellar   button.      SURGEON:  Pietro Cassis. Alvan Dame, M.D.      ASSISTANT:  Danae Orleans, PA-C.      ANESTHESIA:  Spinal.      SPECIMENS:  None.      COMPLICATION:  None.      DRAINS:  None.  EBL: 250cc      TOURNIQUET TIME:   Total Tourniquet Time Documented: Thigh (Left) - 39 minutes Total: Thigh (Left) - 39 minutes  .      The patient was stable to the recovery room.      INDICATION FOR PROCEDURE:  Mary Moody is a 73 y.o. female patient of   mine.  The patient had been seen, evaluated, and treated conservatively in the   office with medication, activity modification, and injections.  The patient had   radiographic changes of bone-on-bone arthritis with endplate sclerosis and osteophytes noted.      The patient failed conservative measures including medication, injections, and activity modification, and at this point was ready for more definitive measures.   Based on the radiographic changes and failed conservative measures, the patient   decided to proceed with total knee replacement.  Risks of infection,   DVT, component failure, need for revision  surgery, postop course, and   expectations were all   discussed and reviewed.  Consent was obtained for benefit of pain   relief.      PROCEDURE IN DETAIL:  The patient was brought to the operative theater.   Once adequate anesthesia, preoperative antibiotics, 2 gm of Ancef  administered, the patient was positioned supine with the left thigh tourniquet placed.  The  left lower extremity was prepped and draped in sterile fashion.  A time-   out was performed identifying the patient, planned procedure, and   extremity.      The left lower extremity was placed in the [place] [employer] leg holder.  The leg was   exsanguinated, tourniquet elevated to 250 mmHg.  A midline incision was   made followed by median parapatellar arthrotomy.  Following initial   exposure, attention was  first directed to the patella.  Precut   measurement was noted to be 20 mm.  I resected down to 14 mm and used a   32 patellar button to restore patellar height as well as cover the cut   surface.      The lug holes were drilled and a metal shim was placed to protect the   patella from retractors and saw blades.      At this point, attention was now directed to the femur.  The femoral   canal was opened with a drill, irrigated to try to prevent fat emboli.  An   intramedullary rod was passed at 5 degrees valgus, 9 mm of bone was   resected off the distal femur due to pre-operative hyperextension.  Following this resection, the tibia was   subluxated anteriorly.  Using the extramedullary guide, 2 mm of bone was resected off   the proximal lateral tibia.  We confirmed the gap would be   stable medially and laterally with a 10 mm insert as well as confirmed   the cut was perpendicular in the coronal plane, checking with an alignment rod.      Once this was done, I sized the femur to be a size 2.5 in the anterior-   posterior dimension, chose a standard component based on medial and   lateral dimension.  The size 2.5 rotation  block was then pinned in   position anterior referenced using the C-clamp to set rotation.  The   anterior, posterior, and  chamfer cuts were made without difficulty nor   notching making certain that I was along the anterior cortex to help   with flexion gap stability.      The final box cut was made off the lateral aspect of distal femur.      At this point, the tibia was sized to be a size 2.5, the size 2.5 tray was   then pinned in position through the medial third of the tubercle,   drilled, and keel punched.  Trial reduction was now carried with a 2.5 femur,  2.5 tibia, a 10 mm insert, and the 32 patella botton.  The knee was brought to   extension, full extension with good flexion stability with the patella   tracking through the trochlea without application of pressure.  Given   all these findings, the trial components removed.  Final components were   opened and cement was mixed.  The knee was irrigated with normal saline   solution and pulse lavage.  The synovial lining was   then injected with 20cc of exparel , 30cc of .25% Marcaine with epinephrine and 1 cc of Toradol,   total of 61 cc.      The knee was irrigated.  Final implants were then cemented onto clean and   dried cut surfaces of bone with the knee brought to extension with a 10 mm trial insert.      Once the cement had fully cured, the excess cement was removed   throughout the knee.  I confirmed I was satisfied with the range of   motion and stability, and the final 10 mm PS insert was chosen.  It was   placed into the knee.      The tourniquet had been let down at 38 minutes.  No significant   hemostasis required.  The   extensor mechanism was then reapproximated using #1 Vicryl and #0 V-lock sutureswith the knee  in flexion.  I then injected her with topically based Tranexamic Acid (2gm combined with 50cc of NS).  The   remaining wound was closed with 2-0 Vicryl and running 4-0 Monocryl.   The knee was  cleaned, dried, dressed sterilely using Dermabond and   Aquacel dressing.  The patient was then   brought to recovery room in stable condition, tolerating the procedure   well.   Please note that Physician Assistant, Danae Orleans, PA-C, was present for the entirety of the case, and was utilized for pre-operative positioning, peri-operative retractor management, general facilitation of the procedure.  He was also utilized for primary wound closure at the end of the case.              Pietro Cassis Alvan Dame, M.D.    07/02/2014 7:34 PM

## 2014-07-02 NOTE — Transfer of Care (Signed)
Immediate Anesthesia Transfer of Care Note  Patient: Mary Moody  Procedure(s) Performed: Procedure(s): LEFT TOTAL KNEE ARTHROPLASTY (Left)  Patient Location: PACU  Anesthesia Type:General  Level of Consciousness: awake, alert , oriented, patient cooperative and responds to stimulation  Airway & Oxygen Therapy: Patient Spontanous Breathing and Patient connected to face mask oxygen  Post-op Assessment: Report given to PACU RN, Post -op Vital signs reviewed and stable and Patient moving all extremities  Post vital signs: Reviewed  Complications: No apparent anesthesia complications

## 2014-07-02 NOTE — Plan of Care (Signed)
Problem: Phase I Progression Outcomes Goal: OOB as tolerated unless otherwise ordered Outcome: Progressing Pt dangled side of bed.

## 2014-07-03 LAB — CBC
HCT: 26.3 % — ABNORMAL LOW (ref 36.0–46.0)
HEMOGLOBIN: 9.2 g/dL — AB (ref 12.0–15.0)
MCH: 30.9 pg (ref 26.0–34.0)
MCHC: 35 g/dL (ref 30.0–36.0)
MCV: 88.3 fL (ref 78.0–100.0)
PLATELETS: 267 10*3/uL (ref 150–400)
RBC: 2.98 MIL/uL — ABNORMAL LOW (ref 3.87–5.11)
RDW: 13.4 % (ref 11.5–15.5)
WBC: 11.5 10*3/uL — ABNORMAL HIGH (ref 4.0–10.5)

## 2014-07-03 LAB — URINALYSIS, ROUTINE W REFLEX MICROSCOPIC
Bilirubin Urine: NEGATIVE
GLUCOSE, UA: NEGATIVE mg/dL
Ketones, ur: NEGATIVE mg/dL
Nitrite: NEGATIVE
PROTEIN: NEGATIVE mg/dL
SPECIFIC GRAVITY, URINE: 1.012 (ref 1.005–1.030)
Urobilinogen, UA: 0.2 mg/dL (ref 0.0–1.0)
pH: 5.5 (ref 5.0–8.0)

## 2014-07-03 LAB — GLUCOSE, CAPILLARY
GLUCOSE-CAPILLARY: 199 mg/dL — AB (ref 70–99)
Glucose-Capillary: 103 mg/dL — ABNORMAL HIGH (ref 70–99)
Glucose-Capillary: 146 mg/dL — ABNORMAL HIGH (ref 70–99)
Glucose-Capillary: 223 mg/dL — ABNORMAL HIGH (ref 70–99)

## 2014-07-03 LAB — BASIC METABOLIC PANEL
ANION GAP: 14 (ref 5–15)
BUN: 19 mg/dL (ref 6–23)
CALCIUM: 9 mg/dL (ref 8.4–10.5)
CO2: 22 mEq/L (ref 19–32)
Chloride: 97 mEq/L (ref 96–112)
Creatinine, Ser: 1.06 mg/dL (ref 0.50–1.10)
GFR, EST AFRICAN AMERICAN: 60 mL/min — AB (ref >90)
GFR, EST NON AFRICAN AMERICAN: 52 mL/min — AB (ref >90)
GLUCOSE: 228 mg/dL — AB (ref 70–99)
POTASSIUM: 4.8 meq/L (ref 3.7–5.3)
SODIUM: 133 meq/L — AB (ref 137–147)

## 2014-07-03 LAB — URINE MICROSCOPIC-ADD ON

## 2014-07-03 MED ORDER — PHENAZOPYRIDINE HCL 100 MG PO TABS
100.0000 mg | ORAL_TABLET | Freq: Three times a day (TID) | ORAL | Status: DC | PRN
  Administered 2014-07-03 – 2014-07-04 (×2): 100 mg via ORAL
  Filled 2014-07-03 (×2): qty 1

## 2014-07-03 NOTE — Progress Notes (Signed)
OT Cancellation Note  Patient Details Name: Mary Moody MRN: [id number] DOB: [birth year]   Cancelled Treatment:    Reason Eval/Treat Not Completed: Pt with plans to go to short term rehab tomorrow at [facility].  Will defer further OT to SNF.  Malka So 07/03/2014, 11:54 AM [phone]

## 2014-07-03 NOTE — Progress Notes (Signed)
[facility]   Subjective: 1 Day Post-Op Procedure(s) (LRB): LEFT TOTAL KNEE ARTHROPLASTY (Left)   Patient reports pain as mild, pain controlled. No events throughout the night.  Objective:   VITALS:   Filed Vitals:   07/03/14 0545  BP: 147/67  Pulse: 98  Temp: 98.2 F (36.8 C)  Resp: 16    Dorsiflexion/Plantar flexion intact Incision: dressing C/D/I No cellulitis present Compartment soft  LABS  Recent Labs  07/03/14 0457  HGB 9.2*  HCT 26.3*  WBC 11.5*  PLT 267     Recent Labs  07/03/14 0457  NA 133*  K 4.8  BUN 19  CREATININE 1.06  GLUCOSE 228*     Assessment/Plan: 1 Day Post-Op Procedure(s) (LRB): LEFT TOTAL KNEE ARTHROPLASTY (Left) Foley cath d/c'ed Advance diet Up with therapy D/C IV fluids Discharge to SNF eventually, when ready  Expected ABLA  Treated with iron and will observe  Obese (BMI 30-39.9) Estimated body mass index is 32.91 kg/(m^2) as calculated from the following:   Height as of this encounter: 5\' 2"  (1.575 m).   Weight as of this encounter: 81.647 kg (180 lb). Patient also counseled that weight may inhibit the healing process Patient counseled that losing weight will help with future health issues        [place] Pugh. Mary Moody   PAC  07/03/2014, 8:16 AM

## 2014-07-03 NOTE — Evaluation (Signed)
Physical Therapy Evaluation Patient Details Name: Mary Moody MRN: [id number] DOB: 01/01/1941 Today's Date: 07/03/2014   History of Present Illness  L TKR  Clinical Impression  Pt s/p L TKR presents with decreased L LE strength/ROM and post op pain limiting functional mobility.  Pt would benefit from follow up rehab at SNF level to maximize IND and safety prior to return home with ltd assist    Follow Up Recommendations SNF    Equipment Recommendations  None recommended by PT    Recommendations for Other Services OT consult     Precautions / Restrictions Precautions Precautions: Knee Restrictions Weight Bearing Restrictions: No Other Position/Activity Restrictions: WBAT      Mobility  Bed Mobility Overal bed mobility: Needs Assistance Bed Mobility: Supine to Sit     Supine to sit: Mod assist     General bed mobility comments: cues for sequence and use of R LE to self assist; physical assist to manage L LE and to bring trunk to upright  Transfers Overall transfer level: Needs assistance Equipment used: Rolling walker (2 wheeled) Transfers: Sit to/from Stand Sit to Stand: Mod assist         General transfer comment: cues for LE management and use of UEs to self assist  Ambulation/Gait Ambulation/Gait assistance: Min assist;Mod assist Ambulation Distance (Feet): 18 Feet (twice - to/from bathroom) Assistive device: Rolling walker (2 wheeled) Gait Pattern/deviations: Step-to pattern;Decreased step length - right;Decreased step length - left;Shuffle;Trunk flexed Gait velocity: decr   General Gait Details: cues for posture, sequence and position from [employer] Mobility    Modified Rankin (Stroke Patients Only)       Balance                                             Pertinent Vitals/Pain Pain Assessment: 0-10 Pain Score: 5  Pain Location: L knee Pain Descriptors / Indicators: Aching Pain  Intervention(s): Limited activity within patient's tolerance;Premedicated before session;Ice applied    Home Living Family/patient expects to be discharged to:: Skilled nursing facility Living Arrangements: Alone                    Prior Function Level of Independence: Independent with assistive device(s)               Hand Dominance        Extremity/Trunk Assessment   Upper Extremity Assessment: Overall WFL for tasks assessed           Lower Extremity Assessment: LLE deficits/detail   LLE Deficits / Details: 3-/5 quads with AAROM at knee -10 - 50     Communication   Communication: No difficulties  Cognition Arousal/Alertness: Awake/alert Behavior During Therapy: WFL for tasks assessed/performed Overall Cognitive Status: Within Functional Limits for tasks assessed                      General Comments      Exercises Total Joint Exercises Ankle Circles/Pumps: AROM;Both;15 reps;Supine Quad Sets: AROM;Both;10 reps;Supine Heel Slides: AAROM;Left;10 reps;Supine Straight Leg Raises: AAROM;Left;10 reps;Supine      Assessment/Plan    PT Assessment Patient needs continued PT services  PT Diagnosis Difficulty walking   PT Problem List Decreased strength;Decreased range of motion;Decreased activity tolerance;Decreased mobility;Decreased knowledge of use of DME;Pain;Obesity  PT Treatment Interventions DME instruction;Gait training;Stair training;Functional mobility training;Therapeutic activities;Therapeutic exercise;Patient/family education   PT Goals (Current goals can be found in the Care Plan section) Acute Rehab PT Goals Patient Stated Goal: Home to dog PT Goal Formulation: With patient Time For Goal Achievement: 07/19/14 Potential to Achieve Goals: Good    Frequency 7X/week   Barriers to discharge        Co-evaluation               End of Session Equipment Utilized During Treatment: Gait belt Activity Tolerance: Patient  tolerated treatment well;Patient limited by fatigue Patient left: in bed;with call bell/phone within reach Nurse Communication: Mobility status         Time: 7867-5449 PT Time Calculation (min): 44 min   Charges:   PT Evaluation $Initial PT Evaluation Tier I: 1 Procedure PT Treatments $Gait Training: 8-22 mins $Therapeutic Exercise: 8-22 mins $Therapeutic Activity: 8-22 mins   PT G Codes:          Mary Moody 07/03/2014, 12:15 PM

## 2014-07-03 NOTE — Progress Notes (Signed)
CARE MANAGEMENT NOTE 07/03/2014  Patient:  YULANDA, DIGGS   Account Number:  [id number]  Date Initiated:  07/03/2014  Documentation initiated by:  DAVIS,RHONDA  Subjective/Objective Assessment:   left total knee replacement     Action/Plan:   snf s/p hospital stay   Anticipated DC Date:  07/04/2014   Anticipated DC Plan:  SKILLED NURSING FACILITY  In-house referral  Clinical Social Worker      DC Planning Services  NA      [facility] Choice  NA   Choice offered to / List presented to:  NA   DME arranged  NA      DME agency  NA     [place] arranged  NA      [place] agency  NA   Status of service:  Completed, signed off Medicare Important Message given?  NA - LOS <3 / Initial given by admissions (If response is "NO", the following Medicare IM given date fields will be blank) Date Medicare IM given:   Medicare IM given by:   Date Additional Medicare IM given:   Additional Medicare IM given by:    Discharge Disposition:  [place]  Per UR Regulation:  Reviewed for med. necessity/level of care/duration of stay  If discussed at [place] of Stay Meetings, dates discussed:    Comments:  Suanne Marker Davis,RN,BSN,CCM

## 2014-07-03 NOTE — Progress Notes (Signed)
Clinical Social Work Department BRIEF PSYCHOSOCIAL ASSESSMENT 07/03/2014  Patient:  Mary Moody, Mary Moody     Account Number:  [id number]     Admit date:  07/02/2014  Clinical Social Worker:  Mary Moody  Date/Time:  07/03/2014 08:29 AM  Referred by:  Physician  Date Referred:  07/03/2014 Referred for  SNF Placement   Other Referral:   Interview type:   Other interview type:    PSYCHOSOCIAL DATA Living Status:  ALONE Admitted from facility:   Level of care:   Primary support name:  Mary Moody Primary support relationship to patient:  FAMILY Degree of support available:   supportive    CURRENT CONCERNS Current Concerns  Post-Acute Placement   Other Concerns:    SOCIAL WORK ASSESSMENT / PLAN Pt is a 73 yr old female living at home prior to hospitalization. CSW met with pt to assist with d/c planning. This is a planned admission. ST Rehab will be needed following hospital d/c. Pt is interested in [facility]. SNF has been contacted and a decision is pending. Pt will consider other options if necessary. CSW will continue to follow to assist with d/c planning needs.   Assessment/plan status:  Psychosocial Support/Ongoing Assessment of Needs Other assessment/ plan:   Information/referral to community resources:   Insurance coverage for SNF and ambulance transport reviewed.    PATIENT'S/FAMILY'S RESPONSE TO PLAN OF CARE: Pt' s mood is bright. Pain is being controlled. Pt is relieved surgery is over and is very pleased with her care. Pt is motivated to begin therapy and is looking forward to going to either  [facility] or [facility] for rehab at d/c.   Mary Lean LCSW [phone]

## 2014-07-03 NOTE — Progress Notes (Signed)
Clinical Social Work Department CLINICAL SOCIAL WORK PLACEMENT NOTE 07/03/2014  Patient:  Mary Moody, Mary Moody  Account Number:  [id number] Admit date:  07/02/2014  Clinical Social Worker:  Werner Lean, LCSW  Date/time:  07/03/2014 08:43 AM  Clinical Social Work is seeking post-discharge placement for this patient at the following level of care:   SKILLED NURSING   (*CSW will update this form in Epic as items are completed)     Patient/family provided with [place] Department of Clinical Social Work's list of facilities offering this level of care within the geographic area requested by the patient (or if unable, by the patient's family).  07/03/2014  Patient/family informed of their freedom to choose among providers that offer the needed level of care, that participate in Medicare, Medicaid or managed care program needed by the patient, have an available bed and are willing to accept the patient.  07/03/2014  Patient/family informed of [facility]' ownership interest in [facility], as well as of the fact that they are under no obligation to receive care at this facility.  PASARR submitted to EDS on 07/02/2014 PASARR number received on 07/02/2014  FL2 transmitted to all facilities in geographic area requested by pt/family on  07/03/2014 FL2 transmitted to all facilities within larger geographic area on   Patient informed that his/her managed care company has contracts with or will negotiate with  certain facilities, including the following:     Patient/family informed of bed offers received:   Patient chooses bed at  Physician recommends and patient chooses bed at    Patient to be transferred to  on   Patient to be transferred to facility by  Patient and family notified of transfer on  Name of family member notified:    The following physician request were entered in Epic:   Additional Comments:  Werner Lean LCSW [phone]

## 2014-07-03 NOTE — Progress Notes (Signed)
Physical Therapy Treatment Patient Details Name: Mary Moody MRN: [id number] DOB: 19-May-1941 Today's Date: 07/23/14    History of Present Illness L TKR    PT Comments    Progressing from am   Follow Up Recommendations  SNF     Equipment Recommendations  None recommended by PT    Recommendations for Other Services OT consult     Precautions / Restrictions Precautions Precautions: Knee Restrictions Weight Bearing Restrictions: No Other Position/Activity Restrictions: WBAT    Mobility  Bed Mobility Overal bed mobility: Needs Assistance Bed Mobility: Supine to Sit     Supine to sit: Min assist     General bed mobility comments: cues for sequence and use of R LE to self assist; physical assist to manage L LE and to bring trunk to upright  Transfers Overall transfer level: Needs assistance Equipment used: Rolling walker (2 wheeled) Transfers: Sit to/from Stand Sit to Stand: Min assist;Mod assist         General transfer comment: cues for LE management and use of UEs to self assist  Ambulation/Gait Ambulation/Gait assistance: Min assist;Mod assist Ambulation Distance (Feet): 29 Feet Assistive device: Rolling walker (2 wheeled) Gait Pattern/deviations: Step-to pattern;Decreased step length - right;Decreased step length - left;Shuffle;Trunk flexed Gait velocity: decr   General Gait Details: cues for posture, sequence and position from [employer] Mobility    Modified Rankin (Stroke Patients Only)       Balance                                    Cognition Arousal/Alertness: Awake/alert Behavior During Therapy: WFL for tasks assessed/performed Overall Cognitive Status: Within Functional Limits for tasks assessed                      Exercises      General Comments        Pertinent Vitals/Pain Pain Assessment: 0-10 Pain Score: 5  Pain Location: L knee Pain Descriptors / Indicators:  Aching;Sore Pain Intervention(s): Limited activity within patient's tolerance;Monitored during session;Premedicated before session;Ice applied    Home Living                      Prior Function            PT Goals (current goals can now be found in the care plan section) Acute Rehab PT Goals Patient Stated Goal: Home to dog PT Goal Formulation: With patient Time For Goal Achievement: 07/19/14 Potential to Achieve Goals: Good Progress towards PT goals: Progressing toward goals    Frequency  7X/week    PT Plan Current plan remains appropriate    Co-evaluation             End of Session Equipment Utilized During Treatment: Gait belt Activity Tolerance: Patient tolerated treatment well;Patient limited by fatigue Patient left: in chair;with call bell/phone within reach     Time: 1457-1520 PT Time Calculation (min): 23 min  Charges:  $Gait Training: 23-37 mins                    G Codes:      Mary Moody Jul 23, 2014, 3:26 PM

## 2014-07-03 NOTE — Progress Notes (Signed)
Pt has accepted rehab placement at [facility]. CSW will continue to follow to assist with d/c planning to SNF.  Werner Lean LCSW [phone]

## 2014-07-04 ENCOUNTER — Inpatient Hospital Stay
Admission: RE | Admit: 2014-07-04 | Discharge: 2014-07-12 | Disposition: A | Discharge status: Home / Self Care | Payer: Medicare Other | Source: Ambulatory Visit | Attending: Internal Medicine | Admitting: Internal Medicine

## 2014-07-04 ENCOUNTER — Encounter (HOSPITAL_COMMUNITY): Payer: Self-pay

## 2014-07-04 ENCOUNTER — Other Ambulatory Visit: Payer: Self-pay | Admitting: *Deleted

## 2014-07-04 DIAGNOSIS — N3289 Other specified disorders of bladder: Secondary | ICD-10-CM | POA: Diagnosis not present

## 2014-07-04 DIAGNOSIS — K429 Umbilical hernia without obstruction or gangrene: Secondary | ICD-10-CM | POA: Diagnosis not present

## 2014-07-04 DIAGNOSIS — E119 Type 2 diabetes mellitus without complications: Secondary | ICD-10-CM | POA: Diagnosis not present

## 2014-07-04 DIAGNOSIS — I10 Essential (primary) hypertension: Secondary | ICD-10-CM | POA: Diagnosis not present

## 2014-07-04 DIAGNOSIS — E669 Obesity, unspecified: Secondary | ICD-10-CM | POA: Diagnosis not present

## 2014-07-04 DIAGNOSIS — Z471 Aftercare following joint replacement surgery: Secondary | ICD-10-CM | POA: Diagnosis not present

## 2014-07-04 DIAGNOSIS — R279 Unspecified lack of coordination: Secondary | ICD-10-CM | POA: Diagnosis not present

## 2014-07-04 DIAGNOSIS — E785 Hyperlipidemia, unspecified: Secondary | ICD-10-CM | POA: Diagnosis not present

## 2014-07-04 DIAGNOSIS — M6281 Muscle weakness (generalized): Secondary | ICD-10-CM | POA: Diagnosis not present

## 2014-07-04 DIAGNOSIS — S8990XA Unspecified injury of unspecified lower leg, initial encounter: Secondary | ICD-10-CM | POA: Diagnosis not present

## 2014-07-04 DIAGNOSIS — Z96659 Presence of unspecified artificial knee joint: Secondary | ICD-10-CM | POA: Diagnosis not present

## 2014-07-04 DIAGNOSIS — D649 Anemia, unspecified: Secondary | ICD-10-CM | POA: Diagnosis not present

## 2014-07-04 DIAGNOSIS — M159 Polyosteoarthritis, unspecified: Secondary | ICD-10-CM | POA: Diagnosis not present

## 2014-07-04 DIAGNOSIS — R262 Difficulty in walking, not elsewhere classified: Secondary | ICD-10-CM | POA: Diagnosis not present

## 2014-07-04 DIAGNOSIS — M25569 Pain in unspecified knee: Secondary | ICD-10-CM | POA: Diagnosis not present

## 2014-07-04 LAB — CBC
HCT: 23.8 % — ABNORMAL LOW (ref 36.0–46.0)
Hemoglobin: 8.7 g/dL — ABNORMAL LOW (ref 12.0–15.0)
MCH: 32 pg (ref 26.0–34.0)
MCHC: 36.6 g/dL — AB (ref 30.0–36.0)
MCV: 87.5 fL (ref 78.0–100.0)
Platelets: 246 10*3/uL (ref 150–400)
RBC: 2.72 MIL/uL — AB (ref 3.87–5.11)
RDW: 13.4 % (ref 11.5–15.5)
WBC: 9.7 10*3/uL (ref 4.0–10.5)

## 2014-07-04 LAB — BASIC METABOLIC PANEL
ANION GAP: 16 — AB (ref 5–15)
BUN: 25 mg/dL — ABNORMAL HIGH (ref 6–23)
CALCIUM: 9.6 mg/dL (ref 8.4–10.5)
CO2: 22 meq/L (ref 19–32)
CREATININE: 1.01 mg/dL (ref 0.50–1.10)
Chloride: 97 mEq/L (ref 96–112)
GFR calc Af Amer: 63 mL/min — ABNORMAL LOW (ref >90)
GFR, EST NON AFRICAN AMERICAN: 55 mL/min — AB (ref >90)
Glucose, Bld: 158 mg/dL — ABNORMAL HIGH (ref 70–99)
Potassium: 4.6 mEq/L (ref 3.7–5.3)
SODIUM: 135 meq/L — AB (ref 137–147)

## 2014-07-04 LAB — GLUCOSE, CAPILLARY
GLUCOSE-CAPILLARY: 122 mg/dL — AB (ref 70–99)
Glucose-Capillary: 131 mg/dL — ABNORMAL HIGH (ref 70–99)

## 2014-07-04 LAB — URINE CULTURE
CULTURE: NO GROWTH
Colony Count: NO GROWTH

## 2014-07-04 MED ORDER — CIPROFLOXACIN HCL 500 MG PO TABS: 500.0000 mg | ORAL_TABLET | Freq: Two times a day (BID) | ORAL | Status: DC

## 2014-07-04 MED ORDER — CYCLOBENZAPRINE HCL 10 MG PO TABS: 10.0000 mg | ORAL_TABLET | Freq: Three times a day (TID) | ORAL | Status: DC | PRN

## 2014-07-04 MED ORDER — ASPIRIN 325 MG PO TBEC: 325.0000 mg | DELAYED_RELEASE_TABLET | Freq: Two times a day (BID) | ORAL | Status: AC

## 2014-07-04 MED ORDER — OXYCODONE HCL 5 MG PO TABS: 5.0000 mg | ORAL_TABLET | ORAL | Status: DC | PRN

## 2014-07-04 MED ORDER — OXYCODONE HCL 5 MG PO TABS: ORAL_TABLET | ORAL | Status: DC

## 2014-07-04 MED ORDER — POLYETHYLENE GLYCOL 3350 17 G PO PACK: 17.0000 g | PACK | Freq: Two times a day (BID) | ORAL | Status: AC

## 2014-07-04 NOTE — Telephone Encounter (Signed)
[facility] 

## 2014-07-04 NOTE — Progress Notes (Signed)
RN gave information to patient about pain medications and other daily medications.  All other questions answered.  Patient transported via [place] to [facility].

## 2014-07-04 NOTE — Progress Notes (Signed)
[facility]   Subjective: 2 Days Post-Op Procedure(s) (LRB): LEFT TOTAL KNEE ARTHROPLASTY (Left)   Patient reports pain as mild, pain controlled. No events throughout the night. Ready to be discharged to skilled nursing facility.  Objective:   VITALS:   Filed Vitals:   07/04/14 0452  BP: 188/77  Pulse: 72  Temp: 98.6 F (37 C)  Resp: 16    Dorsiflexion/Plantar flexion intact Incision: dressing C/D/I No cellulitis present Compartment soft  LABS  Recent Labs  07/03/14 0457 07/04/14 0447  HGB 9.2* 8.7*  HCT 26.3* 23.8*  WBC 11.5* 9.7  PLT 267 246     Recent Labs  07/03/14 0457 07/04/14 0447  NA 133* 135*  K 4.8 4.6  BUN 19 25*  CREATININE 1.06 1.01  GLUCOSE 228* 158*     Assessment/Plan: 2 Days Post-Op Procedure(s) (LRB): LEFT TOTAL KNEE ARTHROPLASTY (Left) Up with therapy Discharge to SNF Follow up in 2 weeks at [facility]. Follow up with OLIN,Joell Usman D in 2 weeks.  Contact information:  [facility] [address]   PAC  07/04/2014, 9:02 AM

## 2014-07-04 NOTE — Progress Notes (Signed)
Physical Therapy Treatment Patient Details Name: Mary Moody MRN: [id number] DOB: [birth year] Today's Date: 18-Jul-2014    History of Present Illness L TKR    PT Comments      Follow Up Recommendations  SNF     Equipment Recommendations  None recommended by PT    Recommendations for Other Services OT consult     Precautions / Restrictions Precautions Precautions: Knee Restrictions Weight Bearing Restrictions: No Other Position/Activity Restrictions: WBAT    Mobility  Bed Mobility                  Transfers Overall transfer level: Needs assistance Equipment used: Rolling walker (2 wheeled) Transfers: Sit to/from Stand Sit to Stand: Min assist         General transfer comment: cues for LE management and use of UEs to self assist  Ambulation/Gait Ambulation/Gait assistance: Min assist Ambulation Distance (Feet): 18 Feet (twice) Assistive device: Rolling walker (2 wheeled) Gait Pattern/deviations: Step-to pattern;Decreased step length - right;Decreased step length - left;Shuffle;Trunk flexed Gait velocity: decr   General Gait Details: cues for posture, sequence and position from [employer]    Modified Rankin (Stroke Patients Only)       Balance                                    Cognition Arousal/Alertness: Awake/alert Behavior During Therapy: WFL for tasks assessed/performed Overall Cognitive Status: Within Functional Limits for tasks assessed                      Exercises Total Joint Exercises Ankle Circles/Pumps: AROM;Both;15 reps;Supine Quad Sets: AROM;Both;Supine;15 reps Heel Slides: AAROM;Left;Supine;20 reps Straight Leg Raises: AAROM;Left;Supine;20 reps Goniometric ROM: AAROM at L knee -10-45    General Comments        Pertinent Vitals/Pain Pain Assessment: 0-10 Pain Score: 6  Pain Location: L knee Pain Descriptors / Indicators: Aching;Sore Pain  Intervention(s): Limited activity within patient's tolerance;Premedicated before session;Ice applied    Home Living                      Prior Function            PT Goals (current goals can now be found in the care plan section) Acute Rehab PT Goals Patient Stated Goal: Home to dog PT Goal Formulation: With patient Time For Goal Achievement: 07/19/14 Potential to Achieve Goals: Good Progress towards PT goals: Progressing toward goals    Frequency  7X/week    PT Plan Current plan remains appropriate    Co-evaluation             End of Session Equipment Utilized During Treatment: Gait belt Activity Tolerance: Patient tolerated treatment well;Patient limited by fatigue Patient left: in chair;with call bell/phone within reach     Time: 9381-8299 PT Time Calculation (min): 29 min  Charges:  $Gait Training: 8-22 mins $Therapeutic Exercise: 8-22 mins                    G Codes:      Brentt Fread 2014/07/18, 12:48 PM

## 2014-07-04 NOTE — Clinical Social Work Placement (Signed)
Clinical Social Work Department CLINICAL SOCIAL WORK PLACEMENT NOTE 07/04/2014  Patient:  Mary Moody, Mary Moody  Account Number:  [id number] Admit date:  07/02/2014  Clinical Social Worker:  Werner Lean, LCSW  Date/time:  07/03/2014 08:43 AM  Clinical Social Work is seeking post-discharge placement for this patient at the following level of care:   SKILLED NURSING   (*CSW will update this form in Epic as items are completed)     Patient/family provided with [facility] Department of Clinical Social Work's list of facilities offering this level of care within the geographic area requested by the patient (or if unable, by the patient's family).  07/03/2014  Patient/family informed of their freedom to choose among providers that offer the needed level of care, that participate in Medicare, Medicaid or managed care program needed by the patient, have an available bed and are willing to accept the patient.  07/03/2014  Patient/family informed of [facility]' ownership interest in [facility], as well as of the fact that they are under no obligation to receive care at this facility.  PASARR submitted to EDS on 07/02/2014 PASARR number received on 07/02/2014  FL2 transmitted to all facilities in geographic area requested by pt/family on  07/03/2014 FL2 transmitted to all facilities within larger geographic area on   Patient informed that his/her managed care company has contracts with or will negotiate with  certain facilities, including the following:     Patient/family informed of bed offers received:  07/03/2014 Patient chooses bed at [facility] Physician recommends and patient chooses bed at    Patient to be transferred to [facility] on  07/04/2014 Patient to be transferred to facility by ptar Patient and family notified of transfer on 07/04/2014 Name of family member notified:  patient and neice  The following physician request were entered in  Epic:   Additional Comments: Eduard Clos, MSW, [place]

## 2014-07-04 NOTE — Clinical Social Work Note (Signed)
Patient for d/c today to SNF bed at [facility].  Patient concerned about transport as her niece is unable to transport- because of a back injury-   Will plan transfer via EMS. Eduard Clos, MSW, [place]

## 2014-07-04 NOTE — Discharge Summary (Signed)
Physician Discharge Summary  Patient ID: Mary Moody MRN: [id number] DOB/AGE: December 24, 1940 73 y.o.  Admit date: 07/02/2014 Discharge date:  07/04/2014  Procedures:  Procedure(s) (LRB): LEFT TOTAL KNEE ARTHROPLASTY (Left)  Attending Physician:  Dr. Paralee Cancel   Admission Diagnoses:   Left knee OA / pain  Discharge Diagnoses:  Principal Problem:   S/P left TKA Active Problems:   Expected blood loss anemia   Obese  Past Medical History  Diagnosis Date  . COPD (chronic obstructive pulmonary disease)   . Hypertension   . Thyroid disease   . Dislocation of right hip     03/2013   . Diabetes mellitus without complication     type II   . Anxiety   . Incontinence of urine   . Hypothyroidism   . Hyperlipidemia   . H/O hiatal hernia   . Chronic low back pain   . Transfusion history     2 units last 7'14 s/p hip surgery    HPI: Mary Moody, 73 y.o. female, has a history of pain and functional disability in the left knee due to arthritis and has failed non-surgical conservative treatments for greater than 12 weeks to includeNSAID's and/or analgesics, corticosteriod injections, use of assistive devices, activity modification and linament. Onset of symptoms was gradual, starting 2+ years ago with gradually worsening course since that time. The patient noted no past surgery on the left knee(s). Patient currently rates pain in the left knee(s) at 10 out of 10 with activity. Patient has worsening of pain with activity and weight bearing, pain that interferes with activities of daily living, pain with passive range of motion, crepitus and joint swelling. Patient has evidence of periarticular osteophytes and joint space narrowing by imaging studies. There is no active infection. Risks, benefits and expectations were discussed with the patient. Risks including but not limited to the risk of anesthesia, blood clots, nerve damage, blood vessel damage, failure of the prosthesis, infection  and up to and including death. Patient understand the risks, benefits and expectations and wishes to proceed with surgery.   PCP: Neale Burly, MD   Discharged Condition: good  Hospital Course:  Patient underwent the above stated procedure on 07/02/2014. Patient tolerated the procedure well and brought to the recovery room in good condition and subsequently to the floor.  POD #1 BP: 147/67 ; Pulse: 98 ; Temp: 98.2 F (36.8 C) ; Resp: 16  Patient reports pain as mild, pain controlled. No events throughout the night. Dorsiflexion/plantar flexion intact, incision: dressing C/D/I, no cellulitis present and compartment soft.   LABS  Basename    HGB  9.2  HCT  26.3   POD #2  BP: 188/77 ; Pulse: 72 ; Temp: 98.6 F (37 C) ; Resp: 16  Patient reports pain as mild, pain controlled. No events throughout the night. Ready to be discharged to skilled nursing facility. Dorsiflexion/plantar flexion intact, incision: dressing C/D/I, no cellulitis present and compartment soft.   LABS  Basename    HGB  8.7  HCT  23.8    Discharge Exam: General appearance: alert, cooperative and no distress Extremities: Homans sign is negative, no sign of DVT, no edema, redness or tenderness in the calves or thighs and no ulcers, gangrene or trophic changes  Disposition: [place] with follow up in 2 weeks   Follow-up Information   Follow up with Mauri Pole, MD. Schedule an appointment as soon as possible for a visit in 2 weeks.   Specialty:  Orthopedic Surgery   Contact information:   [address] [place] [address] [phone]       Discharge Instructions   Call MD / Call 911    Complete by:  As directed   If you experience chest pain or shortness of breath, CALL 911 and be transported to the hospital emergency room.  If you develope a fever above 101 F, pus (white drainage) or increased drainage or redness at the wound, or calf pain, call your surgeon's  office.     Change dressing    Complete by:  As directed   Maintain surgical dressing for 10-14 days, or until follow up in the clinic.     Constipation Prevention    Complete by:  As directed   Drink plenty of fluids.  Prune juice may be helpful.  You may use a stool softener, such as Colace (over the counter) 100 mg twice a day.  Use MiraLax (over the counter) for constipation as needed.     Diet - low sodium heart healthy    Complete by:  As directed      Discharge instructions    Complete by:  As directed   Maintain surgical dressing for 10-14 days, or until follow up in the clinic. Follow up in 2 weeks at [facility]. Call with any questions or concerns.     Increase activity slowly as tolerated    Complete by:  As directed      TED hose    Complete by:  As directed   Use stockings (TED hose) for 2 weeks on both leg(s).  You may remove them at night for sleeping.     Weight bearing as tolerated    Complete by:  As directed   Laterality:  left  Extremity:  Lower             Medication List    STOP taking these medications       oxyCODONE-acetaminophen 10-325 MG per tablet  Commonly known as:  PERCOCET      TAKE these medications       aspirin 325 MG EC tablet  Take 1 tablet (325 mg total) by mouth 2 (two) times daily.     beta carotene w/minerals tablet  Take 1 tablet by mouth daily.     busPIRone 30 MG tablet  Commonly known as:  BUSPAR  Take 30 mg by mouth 2 (two) times daily.     calcium-vitamin D 500-200 MG-UNIT per tablet  Commonly known as:  OSCAL WITH D  Take 1 tablet by mouth 3 (three) times daily.     ciprofloxacin 500 MG tablet  Commonly known as:  CIPRO  Take 1 tablet (500 mg total) by mouth 2 (two) times daily.     cyclobenzaprine 10 MG tablet  Commonly known as:  FLEXERIL  Take 1 tablet (10 mg total) by mouth 3 (three) times daily as needed for muscle spasms.     darifenacin 15 MG 24 hr tablet  Commonly known as:  ENABLEX  Take  15 mg by mouth daily.     docusate sodium 100 MG capsule  Commonly known as:  COLACE  Take 100 mg by mouth 2 (two) times daily.     ferrous sulfate 325 (65 FE) MG tablet  Take 325 mg by mouth daily with breakfast.     gemfibrozil 600 MG tablet  Commonly known as:  LOPID  Take 600 mg by mouth 2 (two) times  daily before a meal.     glimepiride 2 MG tablet  Commonly known as:  AMARYL  Take 2 mg by mouth daily before breakfast.     hydrochlorothiazide 25 MG tablet  Commonly known as:  HYDRODIURIL  Take 25 mg by mouth every morning.     isosorbide mononitrate 30 MG 24 hr tablet  Commonly known as:  IMDUR  Take 30 mg by mouth every morning.     labetalol 200 MG tablet  Commonly known as:  NORMODYNE  Take 200 mg by mouth every morning.     levothyroxine 100 MCG tablet  Commonly known as:  SYNTHROID, LEVOTHROID  Take 100 mcg by mouth daily before breakfast.     lisinopril 10 MG tablet  Commonly known as:  PRINIVIL,ZESTRIL  Take 5 mg by mouth every morning.     Melatonin 3 MG Tabs  Take 3 mg by mouth at bedtime as needed (for sleep).     multivitamin tablet  Take 1 tablet by mouth daily.     oxybutynin 5 MG tablet  Commonly known as:  DITROPAN  Take 5 mg by mouth daily.     oxyCODONE 5 MG immediate release tablet  Commonly known as:  Oxy IR/ROXICODONE  Take 1-3 tablets (5-15 mg total) by mouth every 4 (four) hours as needed for severe pain.     pantoprazole 40 MG tablet  Commonly known as:  PROTONIX  Take 40 mg by mouth daily.     polyethylene glycol packet  Commonly known as:  MIRALAX / GLYCOLAX  Take 17 g by mouth 2 (two) times daily.     simvastatin 20 MG tablet  Commonly known as:  ZOCOR  Take 20 mg by mouth daily.     Vitamin D-3 1000 UNITS Caps  Take 1,000 Units by mouth daily.         Signed: [place]. Alexia Dinger   PA-C  07/04/2014, 9:07 AM

## 2014-07-05 LAB — GLUCOSE, CAPILLARY
Glucose-Capillary: 222 mg/dL — ABNORMAL HIGH (ref 70–99)
Glucose-Capillary: 179 mg/dL — ABNORMAL HIGH (ref 70–99)

## 2014-07-06 LAB — GLUCOSE, CAPILLARY
GLUCOSE-CAPILLARY: 203 mg/dL — AB (ref 70–99)
GLUCOSE-CAPILLARY: 264 mg/dL — AB (ref 70–99)
Glucose-Capillary: 148 mg/dL — ABNORMAL HIGH (ref 70–99)
Glucose-Capillary: 190 mg/dL — ABNORMAL HIGH (ref 70–99)

## 2014-07-07 LAB — GLUCOSE, CAPILLARY
GLUCOSE-CAPILLARY: 153 mg/dL — AB (ref 70–99)
Glucose-Capillary: 150 mg/dL — ABNORMAL HIGH (ref 70–99)
Glucose-Capillary: 167 mg/dL — ABNORMAL HIGH (ref 70–99)

## 2014-07-08 ENCOUNTER — Non-Acute Institutional Stay (SKILLED_NURSING_FACILITY): Payer: Medicare Other | Admitting: Internal Medicine

## 2014-07-08 DIAGNOSIS — N3289 Other specified disorders of bladder: Secondary | ICD-10-CM

## 2014-07-08 DIAGNOSIS — K429 Umbilical hernia without obstruction or gangrene: Secondary | ICD-10-CM | POA: Diagnosis not present

## 2014-07-08 LAB — GLUCOSE, CAPILLARY
Glucose-Capillary: 144 mg/dL — ABNORMAL HIGH (ref 70–99)
Glucose-Capillary: 156 mg/dL — ABNORMAL HIGH (ref 70–99)

## 2014-07-09 ENCOUNTER — Other Ambulatory Visit: Payer: Self-pay | Admitting: *Deleted

## 2014-07-09 ENCOUNTER — Encounter: Payer: Self-pay | Admitting: Internal Medicine

## 2014-07-09 MED ORDER — OXYCODONE HCL 5 MG PO TABS: ORAL_TABLET | ORAL | Status: DC

## 2014-07-09 NOTE — Telephone Encounter (Signed)
[facility] 

## 2014-07-09 NOTE — Progress Notes (Signed)
This encounter was created in error - please disregard.

## 2014-07-10 NOTE — Progress Notes (Addendum)
Patient ID: Mary Moody, female   DOB: 1941/03/23, 73 y.o.   MRN: [id number]            PROGRESS NOTE  DATE:  07/08/2014    FACILITY: [facility]    LEVEL OF CARE:   SNF   Acute Visit   CHIEF COMPLAINT:  Review, post stay at [facility], 07/02/2014 through 07/04/2014.    HISTORY OF PRESENT ILLNESS:  This is a patient who was admitted electively for a left total knee arthroplasty due to severe osteoarthritis that had failed nonsurgical management.  She appears to have had a reasonably uneventful postoperative course.    The patient is seen today.  She is likely to be discharged this week.    CURRENT MEDICATIONS:  Medication list is reviewed.     REVIEW OF SYSTEMS:   CHEST/RESPIRATORY:  She is listed as having COPD.  She is an ex-smoker, quitting years ago.  She is not complaining of cough or shortness of breath.   CARDIAC:   No chest pain.   GI:  She has pointed out that she has a periumbilical hernia.  This has given her episodic pain in the past and she asked me to have a look at this.   GU:  She tells me that she had back surgery years ago and this left her with chronic bladder spasms which are extremely uncomfortable.  Apparently, she has been on both Enablex and Ditropan in the past.  She claims to have had a bladder ultrasound, although I do not see this result during this hospitalization.    PHYSICAL EXAMINATION:   GENERAL APPEARANCE:  Very pleasant woman in no distress.   CHEST/RESPIRATORY:  Clear air entry bilaterally.   CARDIOVASCULAR:  CARDIAC:   Heart sounds are normal.  There are no murmurs.  She appears to be euvolemic.   GASTROINTESTINAL:   HERNIA:  She has a very significant periumbilical hernia with two separate orifices.  The superior one to the right of her umbilicus reduces fairly easily, although the lower one to the left of her umbilicus does not reduce so easily.     ABDOMEN:  Her bowel sounds are positive.  She is otherwise reasonably  asymptomatic.   GENITOURINARY:  BLADDER:   As far as I can tell, her bladder is not distended.  I will check a bladder ultrasound on her.   LABORATORY DATA:    Lab work shows a white count of 10.8, hemoglobin at 8.3 on 07/05/2014.    Comprehensive metabolic panel reasonably unremarkable.    Also of note, her hemoglobin today was 8.1.  This will need to be rechecked when she probably leaves the facility.    In terms of her diabetes, her CBGs are fairly consistently in the mid 100s.  We can discontinue the frequency of these.  On glimepiride.    ASSESSMENT/PLAN:  Status post left total knee.  This was electively done.  The incision looks fine.  Everything looks really good in terms of her surgical outcome.    Complaints of severe bladder spasms which are worse every time she has surgery, according to the patient.  I note she had two urinalyses and a negative urine culture in the hospital.  A postvoid bladder scan is probably in order.    Periumbilical hernia.  I have told her that I think this needs to come to the attention of a general surgeon at this point, although this is not urgent.  I think in  the next several months after she makes her recovery from her knee surgery, she probably should see Dr. Geroge Baseman whom she has seen in the past for gallbladder surgery. This would historically seem to be symptomatic.

## 2014-07-11 DIAGNOSIS — K429 Umbilical hernia without obstruction or gangrene: Secondary | ICD-10-CM | POA: Insufficient documentation

## 2014-07-11 DIAGNOSIS — N3289 Other specified disorders of bladder: Secondary | ICD-10-CM | POA: Insufficient documentation

## 2014-07-22 DIAGNOSIS — M25569 Pain in unspecified knee: Secondary | ICD-10-CM | POA: Diagnosis not present

## 2014-07-22 DIAGNOSIS — M6281 Muscle weakness (generalized): Secondary | ICD-10-CM | POA: Diagnosis not present

## 2014-07-22 DIAGNOSIS — R262 Difficulty in walking, not elsewhere classified: Secondary | ICD-10-CM | POA: Diagnosis not present

## 2014-07-24 DIAGNOSIS — M6281 Muscle weakness (generalized): Secondary | ICD-10-CM | POA: Diagnosis not present

## 2014-07-24 DIAGNOSIS — R262 Difficulty in walking, not elsewhere classified: Secondary | ICD-10-CM | POA: Diagnosis not present

## 2014-07-24 DIAGNOSIS — M25569 Pain in unspecified knee: Secondary | ICD-10-CM | POA: Diagnosis not present

## 2014-07-26 DIAGNOSIS — M6281 Muscle weakness (generalized): Secondary | ICD-10-CM | POA: Diagnosis not present

## 2014-07-26 DIAGNOSIS — M25569 Pain in unspecified knee: Secondary | ICD-10-CM | POA: Diagnosis not present

## 2014-07-26 DIAGNOSIS — R262 Difficulty in walking, not elsewhere classified: Secondary | ICD-10-CM | POA: Diagnosis not present

## 2014-07-30 DIAGNOSIS — M6281 Muscle weakness (generalized): Secondary | ICD-10-CM | POA: Diagnosis not present

## 2014-07-30 DIAGNOSIS — R262 Difficulty in walking, not elsewhere classified: Secondary | ICD-10-CM | POA: Diagnosis not present

## 2014-07-30 DIAGNOSIS — M25579 Pain in unspecified ankle and joints of unspecified foot: Secondary | ICD-10-CM | POA: Diagnosis not present

## 2014-07-30 DIAGNOSIS — M25569 Pain in unspecified knee: Secondary | ICD-10-CM | POA: Diagnosis not present

## 2014-07-31 DIAGNOSIS — M25569 Pain in unspecified knee: Secondary | ICD-10-CM | POA: Diagnosis not present

## 2014-07-31 DIAGNOSIS — M6281 Muscle weakness (generalized): Secondary | ICD-10-CM | POA: Diagnosis not present

## 2014-07-31 DIAGNOSIS — R262 Difficulty in walking, not elsewhere classified: Secondary | ICD-10-CM | POA: Diagnosis not present

## 2014-08-05 DIAGNOSIS — M6281 Muscle weakness (generalized): Secondary | ICD-10-CM | POA: Diagnosis not present

## 2014-08-05 DIAGNOSIS — R262 Difficulty in walking, not elsewhere classified: Secondary | ICD-10-CM | POA: Diagnosis not present

## 2014-08-05 DIAGNOSIS — M25569 Pain in unspecified knee: Secondary | ICD-10-CM | POA: Diagnosis not present

## 2014-08-06 DIAGNOSIS — M25569 Pain in unspecified knee: Secondary | ICD-10-CM | POA: Diagnosis not present

## 2014-08-06 DIAGNOSIS — M6281 Muscle weakness (generalized): Secondary | ICD-10-CM | POA: Diagnosis not present

## 2014-08-06 DIAGNOSIS — R262 Difficulty in walking, not elsewhere classified: Secondary | ICD-10-CM | POA: Diagnosis not present

## 2014-08-08 DIAGNOSIS — M25569 Pain in unspecified knee: Secondary | ICD-10-CM | POA: Diagnosis not present

## 2014-08-08 DIAGNOSIS — M6281 Muscle weakness (generalized): Secondary | ICD-10-CM | POA: Diagnosis not present

## 2014-08-08 DIAGNOSIS — R262 Difficulty in walking, not elsewhere classified: Secondary | ICD-10-CM | POA: Diagnosis not present

## 2014-08-12 DIAGNOSIS — M6281 Muscle weakness (generalized): Secondary | ICD-10-CM | POA: Diagnosis not present

## 2014-08-12 DIAGNOSIS — R262 Difficulty in walking, not elsewhere classified: Secondary | ICD-10-CM | POA: Diagnosis not present

## 2014-08-12 DIAGNOSIS — M25569 Pain in unspecified knee: Secondary | ICD-10-CM | POA: Diagnosis not present

## 2014-08-14 DIAGNOSIS — Z471 Aftercare following joint replacement surgery: Secondary | ICD-10-CM | POA: Diagnosis not present

## 2014-08-15 DIAGNOSIS — M6281 Muscle weakness (generalized): Secondary | ICD-10-CM | POA: Diagnosis not present

## 2014-08-15 DIAGNOSIS — M25569 Pain in unspecified knee: Secondary | ICD-10-CM | POA: Diagnosis not present

## 2014-08-15 DIAGNOSIS — R262 Difficulty in walking, not elsewhere classified: Secondary | ICD-10-CM | POA: Diagnosis not present

## 2014-08-20 DIAGNOSIS — M25569 Pain in unspecified knee: Secondary | ICD-10-CM | POA: Diagnosis not present

## 2014-08-20 DIAGNOSIS — R262 Difficulty in walking, not elsewhere classified: Secondary | ICD-10-CM | POA: Diagnosis not present

## 2014-08-20 DIAGNOSIS — M6281 Muscle weakness (generalized): Secondary | ICD-10-CM | POA: Diagnosis not present

## 2014-08-22 DIAGNOSIS — R262 Difficulty in walking, not elsewhere classified: Secondary | ICD-10-CM | POA: Diagnosis not present

## 2014-08-22 DIAGNOSIS — M25562 Pain in left knee: Secondary | ICD-10-CM | POA: Diagnosis not present

## 2014-08-22 DIAGNOSIS — M6281 Muscle weakness (generalized): Secondary | ICD-10-CM | POA: Diagnosis not present

## 2014-08-27 DIAGNOSIS — R262 Difficulty in walking, not elsewhere classified: Secondary | ICD-10-CM | POA: Diagnosis not present

## 2014-08-27 DIAGNOSIS — M6281 Muscle weakness (generalized): Secondary | ICD-10-CM | POA: Diagnosis not present

## 2014-08-27 DIAGNOSIS — M25562 Pain in left knee: Secondary | ICD-10-CM | POA: Diagnosis not present

## 2014-09-11 DIAGNOSIS — I10 Essential (primary) hypertension: Secondary | ICD-10-CM | POA: Diagnosis not present

## 2014-09-11 DIAGNOSIS — E119 Type 2 diabetes mellitus without complications: Secondary | ICD-10-CM | POA: Diagnosis not present

## 2014-09-11 DIAGNOSIS — E782 Mixed hyperlipidemia: Secondary | ICD-10-CM | POA: Diagnosis not present

## 2014-09-12 DIAGNOSIS — Z23 Encounter for immunization: Secondary | ICD-10-CM | POA: Diagnosis not present

## 2014-09-19 DIAGNOSIS — M6281 Muscle weakness (generalized): Secondary | ICD-10-CM | POA: Diagnosis not present

## 2014-09-19 DIAGNOSIS — R262 Difficulty in walking, not elsewhere classified: Secondary | ICD-10-CM | POA: Diagnosis not present

## 2014-09-19 DIAGNOSIS — M25562 Pain in left knee: Secondary | ICD-10-CM | POA: Diagnosis not present

## 2014-09-24 DIAGNOSIS — M6281 Muscle weakness (generalized): Secondary | ICD-10-CM | POA: Diagnosis not present

## 2014-09-24 DIAGNOSIS — M25562 Pain in left knee: Secondary | ICD-10-CM | POA: Diagnosis not present

## 2014-09-24 DIAGNOSIS — R262 Difficulty in walking, not elsewhere classified: Secondary | ICD-10-CM | POA: Diagnosis not present

## 2014-09-30 DIAGNOSIS — E119 Type 2 diabetes mellitus without complications: Secondary | ICD-10-CM | POA: Diagnosis not present

## 2014-09-30 DIAGNOSIS — I10 Essential (primary) hypertension: Secondary | ICD-10-CM | POA: Diagnosis not present

## 2014-11-05 DIAGNOSIS — J018 Other acute sinusitis: Secondary | ICD-10-CM | POA: Diagnosis not present

## 2014-11-05 DIAGNOSIS — E119 Type 2 diabetes mellitus without complications: Secondary | ICD-10-CM | POA: Diagnosis not present

## 2014-11-05 DIAGNOSIS — I10 Essential (primary) hypertension: Secondary | ICD-10-CM | POA: Diagnosis not present

## 2014-11-11 DIAGNOSIS — H3530 Unspecified macular degeneration: Secondary | ICD-10-CM | POA: Diagnosis not present

## 2014-11-11 DIAGNOSIS — F329 Major depressive disorder, single episode, unspecified: Secondary | ICD-10-CM | POA: Diagnosis not present

## 2014-11-11 DIAGNOSIS — Z79899 Other long term (current) drug therapy: Secondary | ICD-10-CM | POA: Diagnosis not present

## 2014-11-11 DIAGNOSIS — I10 Essential (primary) hypertension: Secondary | ICD-10-CM | POA: Diagnosis not present

## 2014-11-11 DIAGNOSIS — I509 Heart failure, unspecified: Secondary | ICD-10-CM | POA: Diagnosis not present

## 2014-11-11 DIAGNOSIS — M549 Dorsalgia, unspecified: Secondary | ICD-10-CM | POA: Diagnosis not present

## 2014-11-11 DIAGNOSIS — D649 Anemia, unspecified: Secondary | ICD-10-CM | POA: Diagnosis not present

## 2014-11-11 DIAGNOSIS — J449 Chronic obstructive pulmonary disease, unspecified: Secondary | ICD-10-CM | POA: Diagnosis not present

## 2014-11-11 DIAGNOSIS — M25559 Pain in unspecified hip: Secondary | ICD-10-CM | POA: Diagnosis not present

## 2014-11-11 DIAGNOSIS — K5790 Diverticulosis of intestine, part unspecified, without perforation or abscess without bleeding: Secondary | ICD-10-CM | POA: Diagnosis not present

## 2014-11-11 DIAGNOSIS — Z8249 Family history of ischemic heart disease and other diseases of the circulatory system: Secondary | ICD-10-CM | POA: Diagnosis not present

## 2014-11-11 DIAGNOSIS — Z720 Tobacco use: Secondary | ICD-10-CM | POA: Diagnosis not present

## 2014-11-11 DIAGNOSIS — E780 Pure hypercholesterolemia: Secondary | ICD-10-CM | POA: Diagnosis not present

## 2014-11-11 DIAGNOSIS — E039 Hypothyroidism, unspecified: Secondary | ICD-10-CM | POA: Diagnosis not present

## 2014-11-11 DIAGNOSIS — M1990 Unspecified osteoarthritis, unspecified site: Secondary | ICD-10-CM | POA: Diagnosis not present

## 2014-11-11 DIAGNOSIS — E119 Type 2 diabetes mellitus without complications: Secondary | ICD-10-CM | POA: Diagnosis not present

## 2014-11-11 DIAGNOSIS — K429 Umbilical hernia without obstruction or gangrene: Secondary | ICD-10-CM | POA: Diagnosis not present

## 2014-11-11 DIAGNOSIS — K219 Gastro-esophageal reflux disease without esophagitis: Secondary | ICD-10-CM | POA: Diagnosis not present

## 2014-11-18 DIAGNOSIS — K439 Ventral hernia without obstruction or gangrene: Secondary | ICD-10-CM | POA: Diagnosis not present

## 2014-11-25 DIAGNOSIS — Z01818 Encounter for other preprocedural examination: Secondary | ICD-10-CM | POA: Diagnosis not present

## 2014-12-03 DIAGNOSIS — Z794 Long term (current) use of insulin: Secondary | ICD-10-CM | POA: Diagnosis not present

## 2014-12-03 DIAGNOSIS — E785 Hyperlipidemia, unspecified: Secondary | ICD-10-CM | POA: Diagnosis not present

## 2014-12-03 DIAGNOSIS — K449 Diaphragmatic hernia without obstruction or gangrene: Secondary | ICD-10-CM | POA: Diagnosis not present

## 2014-12-03 DIAGNOSIS — E119 Type 2 diabetes mellitus without complications: Secondary | ICD-10-CM | POA: Diagnosis not present

## 2014-12-03 DIAGNOSIS — Z888 Allergy status to other drugs, medicaments and biological substances status: Secondary | ICD-10-CM | POA: Diagnosis not present

## 2014-12-03 DIAGNOSIS — Z23 Encounter for immunization: Secondary | ICD-10-CM | POA: Diagnosis not present

## 2014-12-03 DIAGNOSIS — G8929 Other chronic pain: Secondary | ICD-10-CM | POA: Diagnosis not present

## 2014-12-03 DIAGNOSIS — Z885 Allergy status to narcotic agent status: Secondary | ICD-10-CM | POA: Diagnosis not present

## 2014-12-03 DIAGNOSIS — Z881 Allergy status to other antibiotic agents status: Secondary | ICD-10-CM | POA: Diagnosis not present

## 2014-12-03 DIAGNOSIS — M545 Low back pain: Secondary | ICD-10-CM | POA: Diagnosis not present

## 2014-12-03 DIAGNOSIS — K421 Umbilical hernia with gangrene: Secondary | ICD-10-CM | POA: Diagnosis not present

## 2014-12-03 DIAGNOSIS — K439 Ventral hernia without obstruction or gangrene: Secondary | ICD-10-CM | POA: Diagnosis not present

## 2014-12-04 DIAGNOSIS — Z881 Allergy status to other antibiotic agents status: Secondary | ICD-10-CM | POA: Diagnosis not present

## 2014-12-04 DIAGNOSIS — G8929 Other chronic pain: Secondary | ICD-10-CM | POA: Diagnosis not present

## 2014-12-04 DIAGNOSIS — Z885 Allergy status to narcotic agent status: Secondary | ICD-10-CM | POA: Diagnosis not present

## 2014-12-04 DIAGNOSIS — E119 Type 2 diabetes mellitus without complications: Secondary | ICD-10-CM | POA: Diagnosis not present

## 2014-12-04 DIAGNOSIS — M545 Low back pain: Secondary | ICD-10-CM | POA: Diagnosis not present

## 2014-12-04 DIAGNOSIS — K421 Umbilical hernia with gangrene: Secondary | ICD-10-CM | POA: Diagnosis not present

## 2014-12-29 DIAGNOSIS — Z883 Allergy status to other anti-infective agents status: Secondary | ICD-10-CM | POA: Diagnosis not present

## 2014-12-29 DIAGNOSIS — I10 Essential (primary) hypertension: Secondary | ICD-10-CM | POA: Diagnosis not present

## 2014-12-29 DIAGNOSIS — Z91018 Allergy to other foods: Secondary | ICD-10-CM | POA: Diagnosis not present

## 2014-12-29 DIAGNOSIS — Z4682 Encounter for fitting and adjustment of non-vascular catheter: Secondary | ICD-10-CM | POA: Diagnosis not present

## 2014-12-29 DIAGNOSIS — Y939 Activity, unspecified: Secondary | ICD-10-CM | POA: Diagnosis not present

## 2014-12-29 DIAGNOSIS — Z96651 Presence of right artificial knee joint: Secondary | ICD-10-CM | POA: Diagnosis present

## 2014-12-29 DIAGNOSIS — M1712 Unilateral primary osteoarthritis, left knee: Secondary | ICD-10-CM | POA: Diagnosis present

## 2014-12-29 DIAGNOSIS — Z781 Physical restraint status: Secondary | ICD-10-CM | POA: Diagnosis not present

## 2014-12-29 DIAGNOSIS — E785 Hyperlipidemia, unspecified: Secondary | ICD-10-CM | POA: Diagnosis present

## 2014-12-29 DIAGNOSIS — K449 Diaphragmatic hernia without obstruction or gangrene: Secondary | ICD-10-CM | POA: Diagnosis present

## 2014-12-29 DIAGNOSIS — M545 Low back pain: Secondary | ICD-10-CM | POA: Diagnosis present

## 2014-12-29 DIAGNOSIS — E039 Hypothyroidism, unspecified: Secondary | ICD-10-CM | POA: Diagnosis present

## 2014-12-29 DIAGNOSIS — J9601 Acute respiratory failure with hypoxia: Secondary | ICD-10-CM | POA: Diagnosis present

## 2014-12-29 DIAGNOSIS — T887XXA Unspecified adverse effect of drug or medicament, initial encounter: Secondary | ICD-10-CM | POA: Diagnosis not present

## 2014-12-29 DIAGNOSIS — J449 Chronic obstructive pulmonary disease, unspecified: Secondary | ICD-10-CM | POA: Diagnosis present

## 2014-12-29 DIAGNOSIS — Z885 Allergy status to narcotic agent status: Secondary | ICD-10-CM | POA: Diagnosis not present

## 2014-12-29 DIAGNOSIS — G8929 Other chronic pain: Secondary | ICD-10-CM | POA: Diagnosis present

## 2014-12-29 DIAGNOSIS — Z888 Allergy status to other drugs, medicaments and biological substances status: Secondary | ICD-10-CM | POA: Diagnosis not present

## 2014-12-29 DIAGNOSIS — F419 Anxiety disorder, unspecified: Secondary | ICD-10-CM | POA: Diagnosis not present

## 2014-12-29 DIAGNOSIS — Z823 Family history of stroke: Secondary | ICD-10-CM | POA: Diagnosis not present

## 2014-12-29 DIAGNOSIS — T783XXA Angioneurotic edema, initial encounter: Secondary | ICD-10-CM | POA: Diagnosis not present

## 2014-12-29 DIAGNOSIS — J9600 Acute respiratory failure, unspecified whether with hypoxia or hypercapnia: Secondary | ICD-10-CM | POA: Diagnosis not present

## 2014-12-29 DIAGNOSIS — T464X5A Adverse effect of angiotensin-converting-enzyme inhibitors, initial encounter: Secondary | ICD-10-CM | POA: Diagnosis not present

## 2014-12-29 DIAGNOSIS — E119 Type 2 diabetes mellitus without complications: Secondary | ICD-10-CM | POA: Diagnosis not present

## 2014-12-29 DIAGNOSIS — T464X1A Poisoning by angiotensin-converting-enzyme inhibitors, accidental (unintentional), initial encounter: Secondary | ICD-10-CM | POA: Diagnosis not present

## 2014-12-29 DIAGNOSIS — F17200 Nicotine dependence, unspecified, uncomplicated: Secondary | ICD-10-CM | POA: Diagnosis present

## 2014-12-29 DIAGNOSIS — K219 Gastro-esophageal reflux disease without esophagitis: Secondary | ICD-10-CM | POA: Diagnosis present

## 2014-12-29 DIAGNOSIS — Z96641 Presence of right artificial hip joint: Secondary | ICD-10-CM | POA: Diagnosis present

## 2014-12-29 DIAGNOSIS — H3530 Unspecified macular degeneration: Secondary | ICD-10-CM | POA: Diagnosis present

## 2014-12-29 DIAGNOSIS — Z79899 Other long term (current) drug therapy: Secondary | ICD-10-CM | POA: Diagnosis not present

## 2015-01-10 DIAGNOSIS — T783XXA Angioneurotic edema, initial encounter: Secondary | ICD-10-CM | POA: Diagnosis not present

## 2015-02-25 DIAGNOSIS — T783XXD Angioneurotic edema, subsequent encounter: Secondary | ICD-10-CM | POA: Diagnosis not present

## 2015-02-25 DIAGNOSIS — I10 Essential (primary) hypertension: Secondary | ICD-10-CM | POA: Diagnosis not present

## 2015-02-25 DIAGNOSIS — E119 Type 2 diabetes mellitus without complications: Secondary | ICD-10-CM | POA: Diagnosis not present

## 2015-04-28 DIAGNOSIS — E119 Type 2 diabetes mellitus without complications: Secondary | ICD-10-CM | POA: Diagnosis not present

## 2015-04-28 DIAGNOSIS — Z0000 Encounter for general adult medical examination without abnormal findings: Secondary | ICD-10-CM | POA: Diagnosis not present

## 2015-04-28 DIAGNOSIS — I10 Essential (primary) hypertension: Secondary | ICD-10-CM | POA: Diagnosis not present

## 2015-04-28 DIAGNOSIS — Z1389 Encounter for screening for other disorder: Secondary | ICD-10-CM | POA: Diagnosis not present

## 2015-06-17 IMAGING — CR DG CHEST 2V
2 series · 2 of 2 positions shown · non-contrast
Comparison: 04/17/2013

CLINICAL DATA: Preoperative evaluation for hip surgery,
hypertension

CHEST - 2 VIEW

[w chest lat]
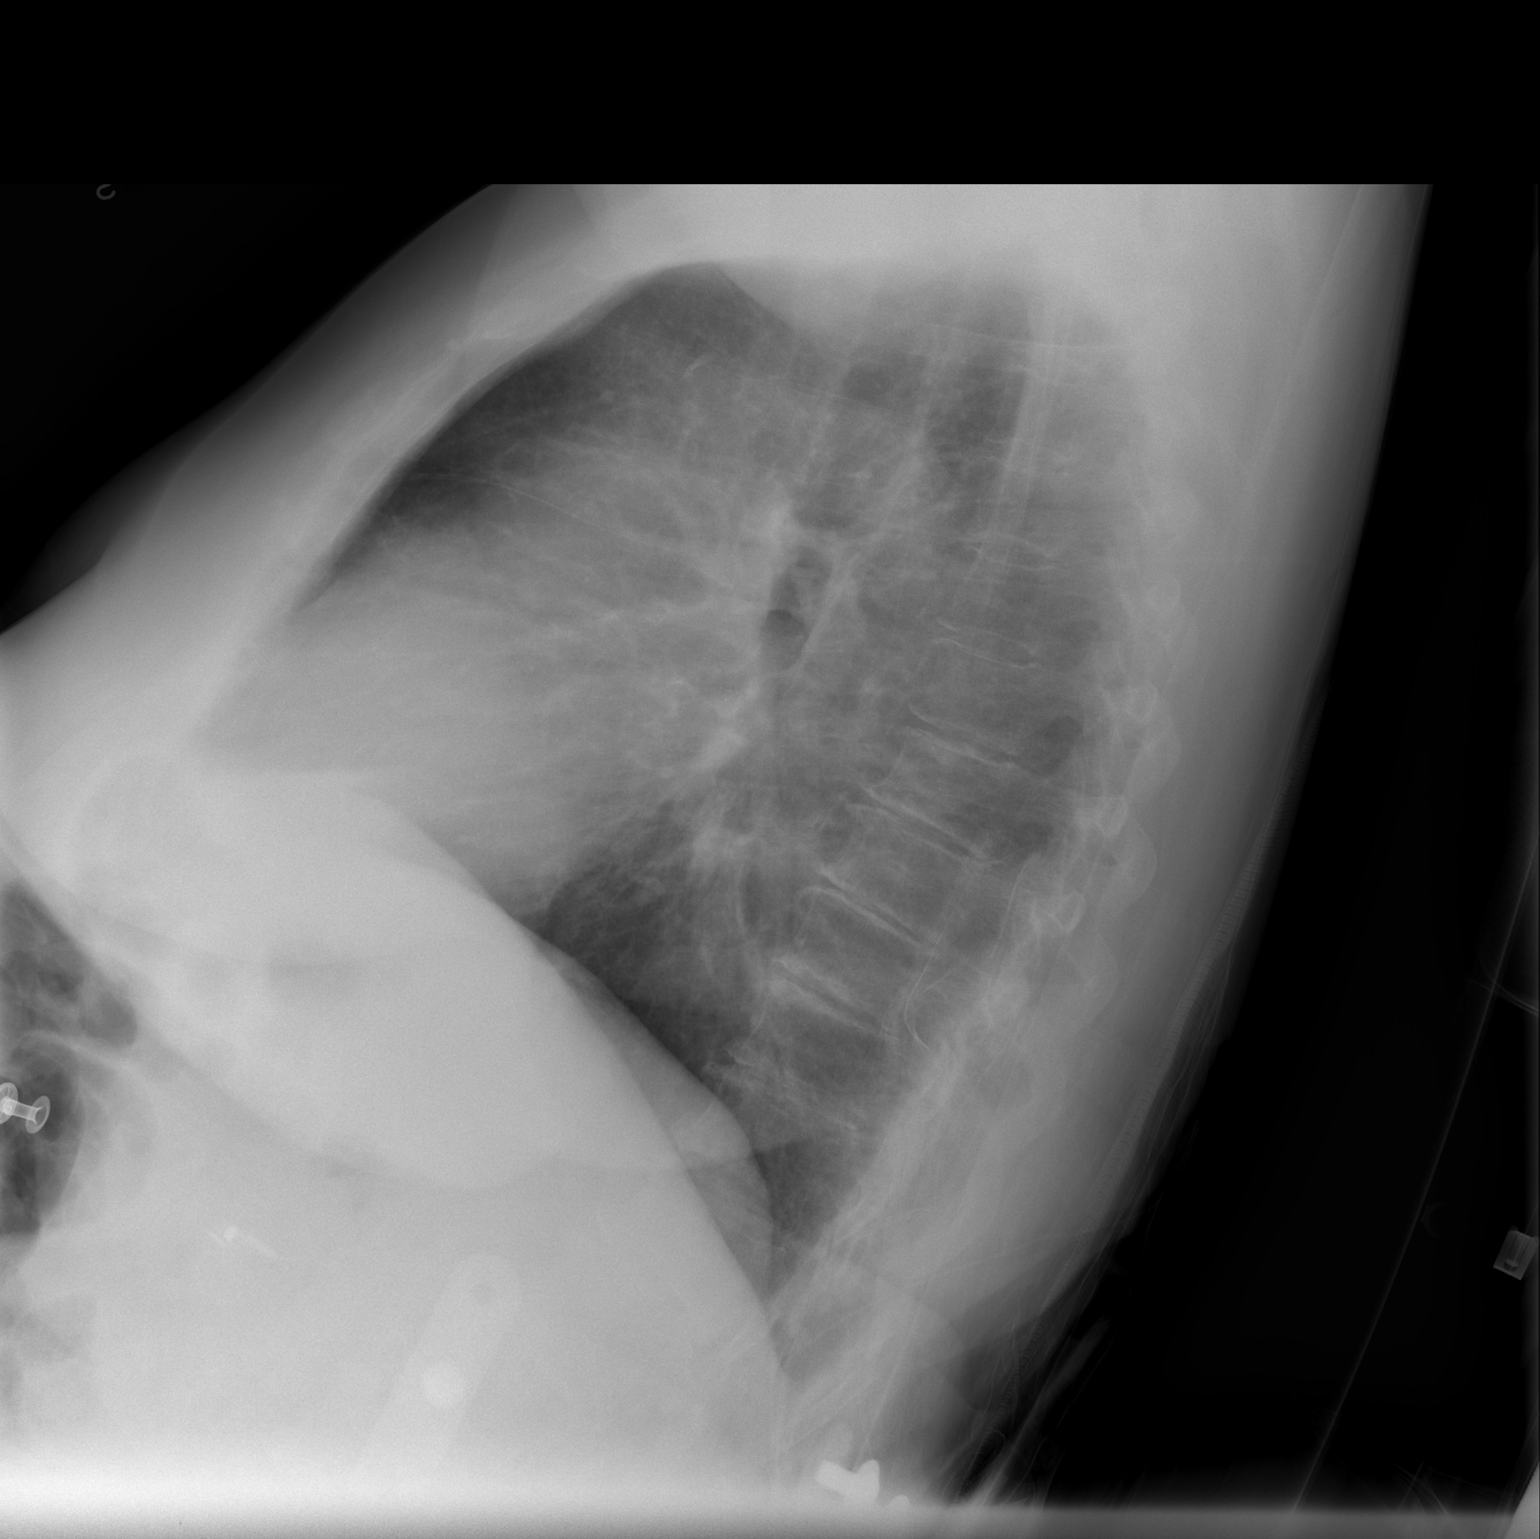

[view not recorded]
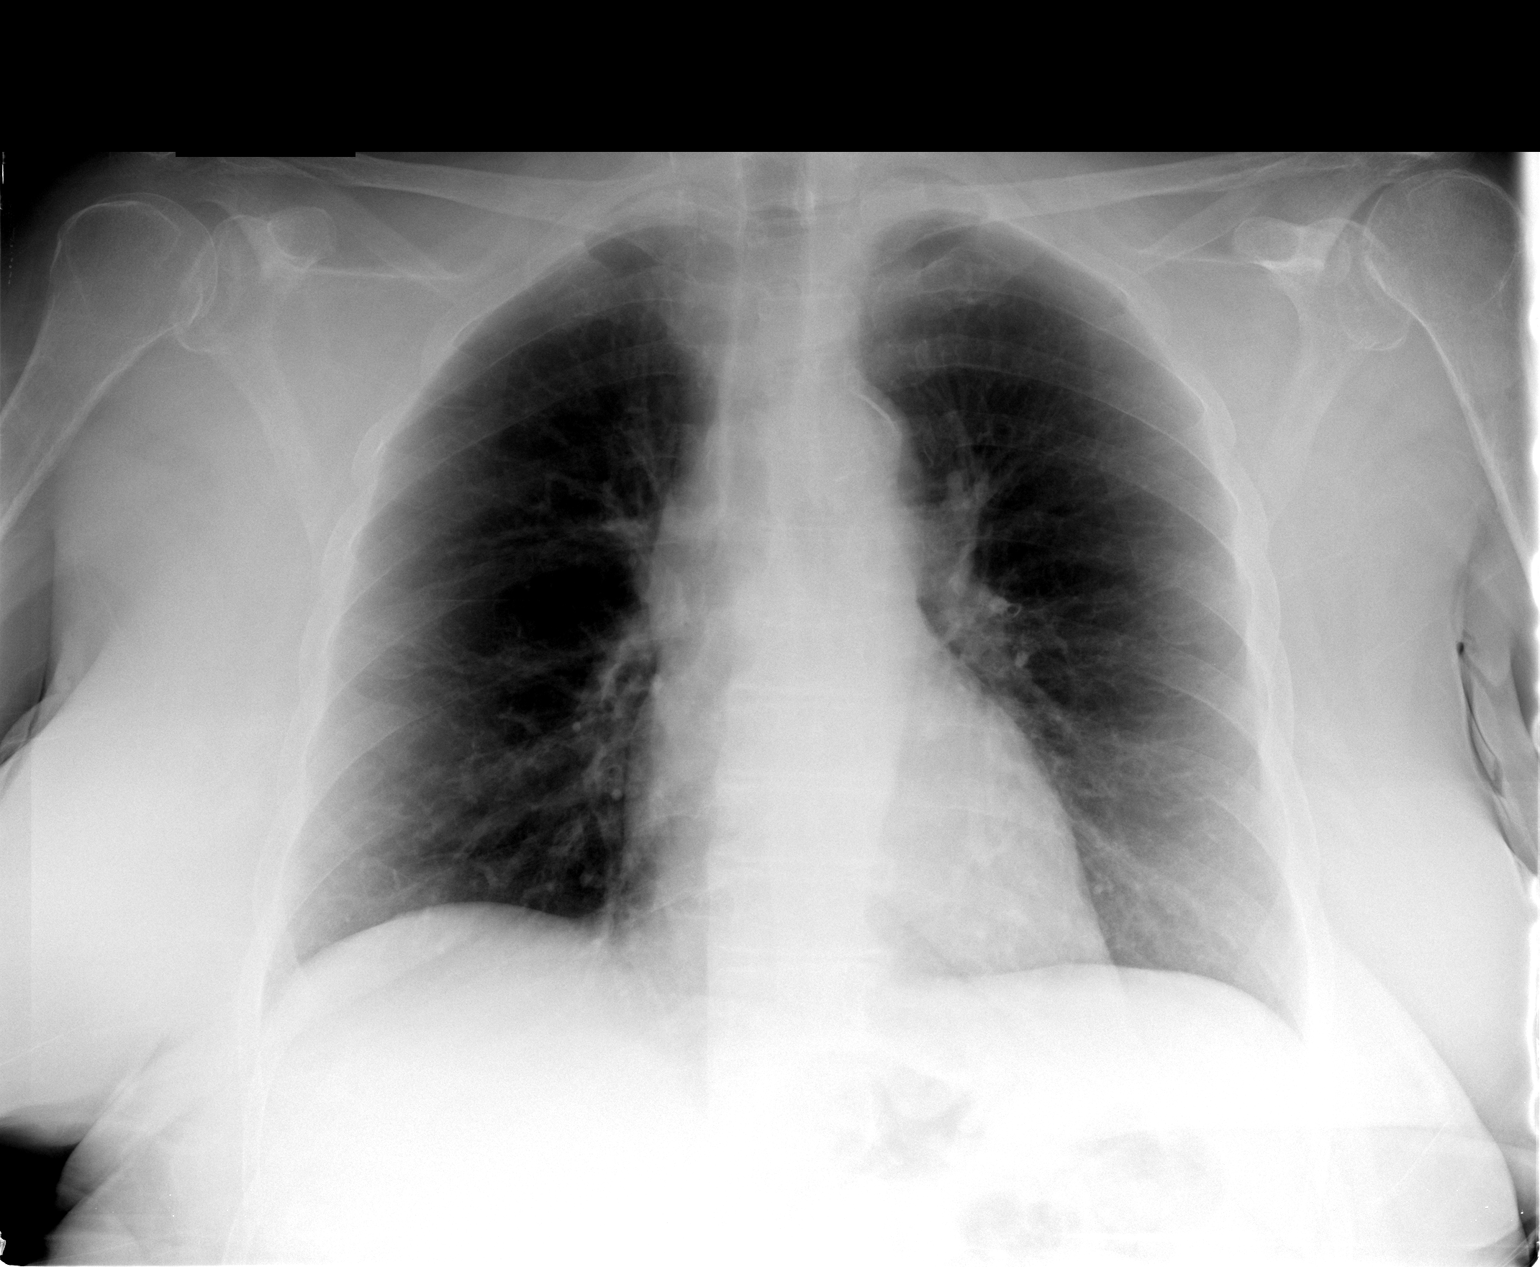

[2 of 2 positions shown; findings below may reference images not displayed]

FINDINGS: The heart and pulmonary vascularity are within normal
limits.  The lungs are clear bilaterally.  No acute bony
abnormality is seen.
IMPRESSION: No acute abnormality noted.

## 2015-06-17 IMAGING — CR DG PORTABLE PELVIS
1 series · 1 of 1 positions shown · non-contrast
Comparison: Plain films 05/07/2013.

CLINICAL DATA: Revision of right hip replacement.

PORTABLE PELVIS

[AP]
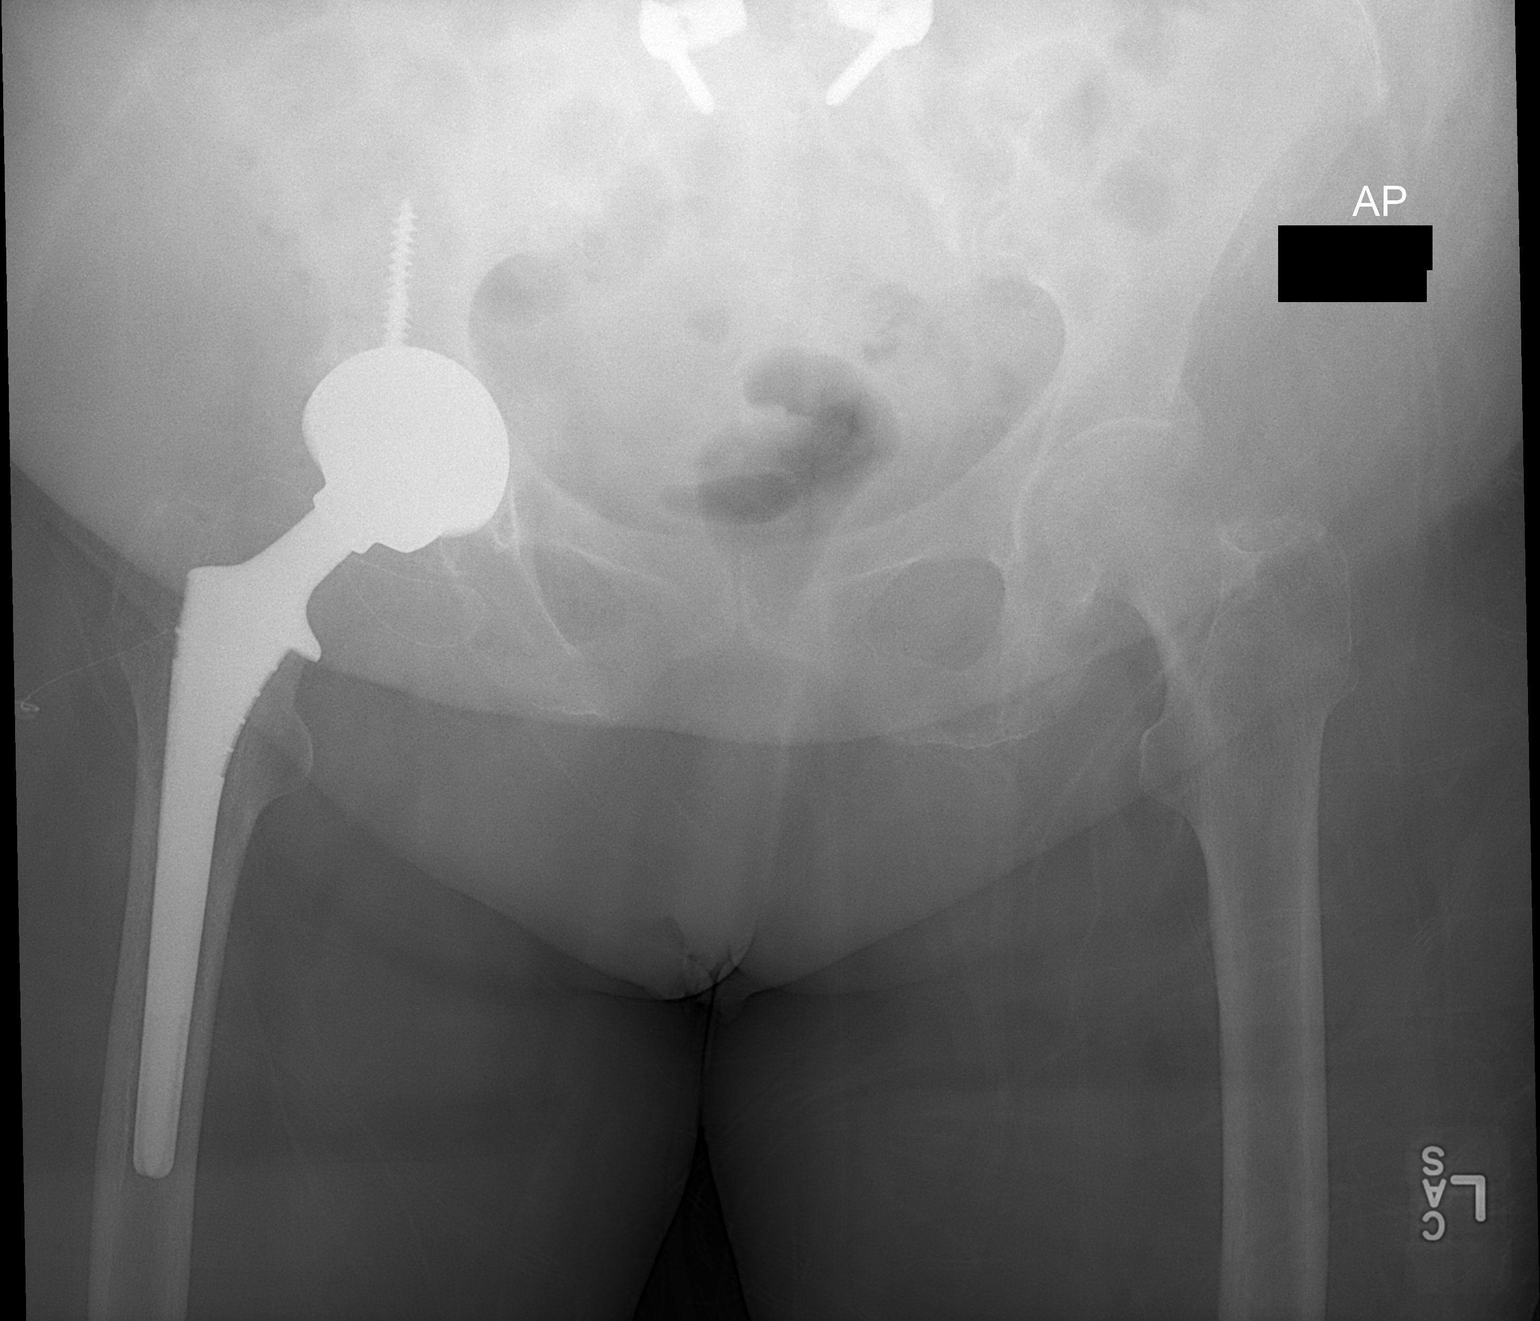

[1 of 1 positions shown; findings below may reference images not displayed]

FINDINGS: The acetabular component of the patient's right total hip
arthroplasty has been replaced.  Hip dislocation seen on the prior
study has been reduced.  There is no fracture or other acute
abnormality.
IMPRESSION: Revision of right hip replacement evidence of complication.
Successful reduction of dislocation.

## 2015-06-26 DIAGNOSIS — H60393 Other infective otitis externa, bilateral: Secondary | ICD-10-CM | POA: Diagnosis not present

## 2015-07-29 DIAGNOSIS — I10 Essential (primary) hypertension: Secondary | ICD-10-CM | POA: Diagnosis not present

## 2015-07-29 DIAGNOSIS — E119 Type 2 diabetes mellitus without complications: Secondary | ICD-10-CM | POA: Diagnosis not present

## 2015-07-29 DIAGNOSIS — J209 Acute bronchitis, unspecified: Secondary | ICD-10-CM | POA: Diagnosis not present

## 2015-08-13 DIAGNOSIS — I10 Essential (primary) hypertension: Secondary | ICD-10-CM | POA: Diagnosis not present

## 2015-08-13 DIAGNOSIS — E119 Type 2 diabetes mellitus without complications: Secondary | ICD-10-CM | POA: Diagnosis not present

## 2015-08-21 DIAGNOSIS — E1121 Type 2 diabetes mellitus with diabetic nephropathy: Secondary | ICD-10-CM | POA: Diagnosis not present

## 2015-08-21 DIAGNOSIS — J208 Acute bronchitis due to other specified organisms: Secondary | ICD-10-CM | POA: Diagnosis not present

## 2015-08-21 DIAGNOSIS — I10 Essential (primary) hypertension: Secondary | ICD-10-CM | POA: Diagnosis not present

## 2015-10-02 DIAGNOSIS — N390 Urinary tract infection, site not specified: Secondary | ICD-10-CM | POA: Diagnosis not present

## 2015-10-21 DIAGNOSIS — E1121 Type 2 diabetes mellitus with diabetic nephropathy: Secondary | ICD-10-CM | POA: Diagnosis not present

## 2015-10-21 DIAGNOSIS — I10 Essential (primary) hypertension: Secondary | ICD-10-CM | POA: Diagnosis not present

## 2015-10-21 DIAGNOSIS — J208 Acute bronchitis due to other specified organisms: Secondary | ICD-10-CM | POA: Diagnosis not present

## 2015-10-24 DIAGNOSIS — R0602 Shortness of breath: Secondary | ICD-10-CM | POA: Diagnosis not present

## 2015-10-24 DIAGNOSIS — R05 Cough: Secondary | ICD-10-CM | POA: Diagnosis not present

## 2015-10-24 DIAGNOSIS — Z87891 Personal history of nicotine dependence: Secondary | ICD-10-CM | POA: Diagnosis not present

## 2016-02-17 DIAGNOSIS — I10 Essential (primary) hypertension: Secondary | ICD-10-CM | POA: Diagnosis not present

## 2016-02-17 DIAGNOSIS — E1121 Type 2 diabetes mellitus with diabetic nephropathy: Secondary | ICD-10-CM | POA: Diagnosis not present

## 2016-05-20 DIAGNOSIS — E1165 Type 2 diabetes mellitus with hyperglycemia: Secondary | ICD-10-CM | POA: Diagnosis not present

## 2016-05-20 DIAGNOSIS — Z1389 Encounter for screening for other disorder: Secondary | ICD-10-CM | POA: Diagnosis not present

## 2016-05-20 DIAGNOSIS — Z0000 Encounter for general adult medical examination without abnormal findings: Secondary | ICD-10-CM | POA: Diagnosis not present

## 2016-05-20 DIAGNOSIS — E784 Other hyperlipidemia: Secondary | ICD-10-CM | POA: Diagnosis not present

## 2016-05-20 DIAGNOSIS — I10 Essential (primary) hypertension: Secondary | ICD-10-CM | POA: Diagnosis not present

## 2016-07-21 IMAGING — CR DG CHEST 2V
2 series · 2 of 2 positions shown · non-contrast
Comparison: 05/22/2013

CLINICAL DATA: Pre operative respiratory exam. Osteoarthritis of
the left knee.

EXAM:
CHEST  2 VIEW

[w chest pa]
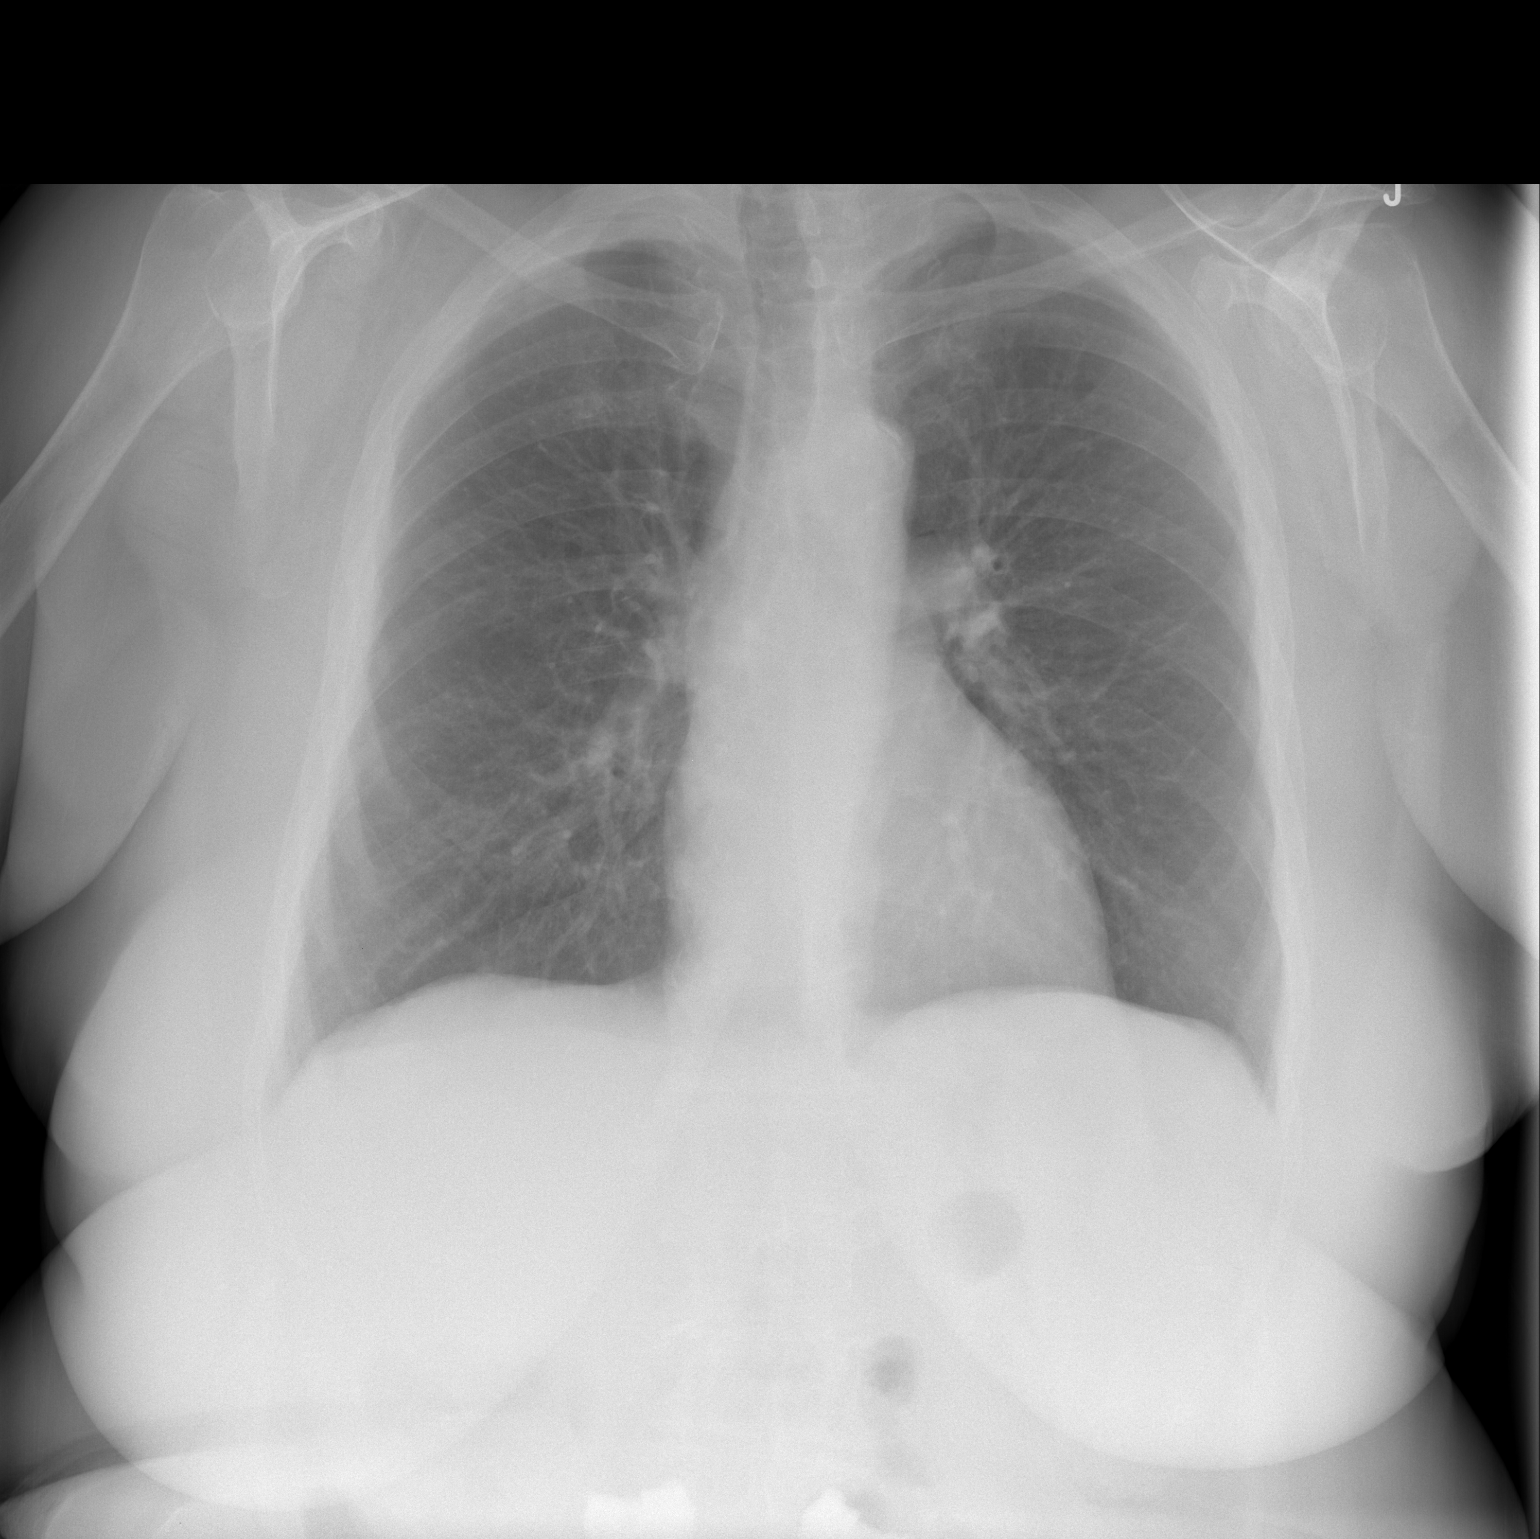

[w chest lat]
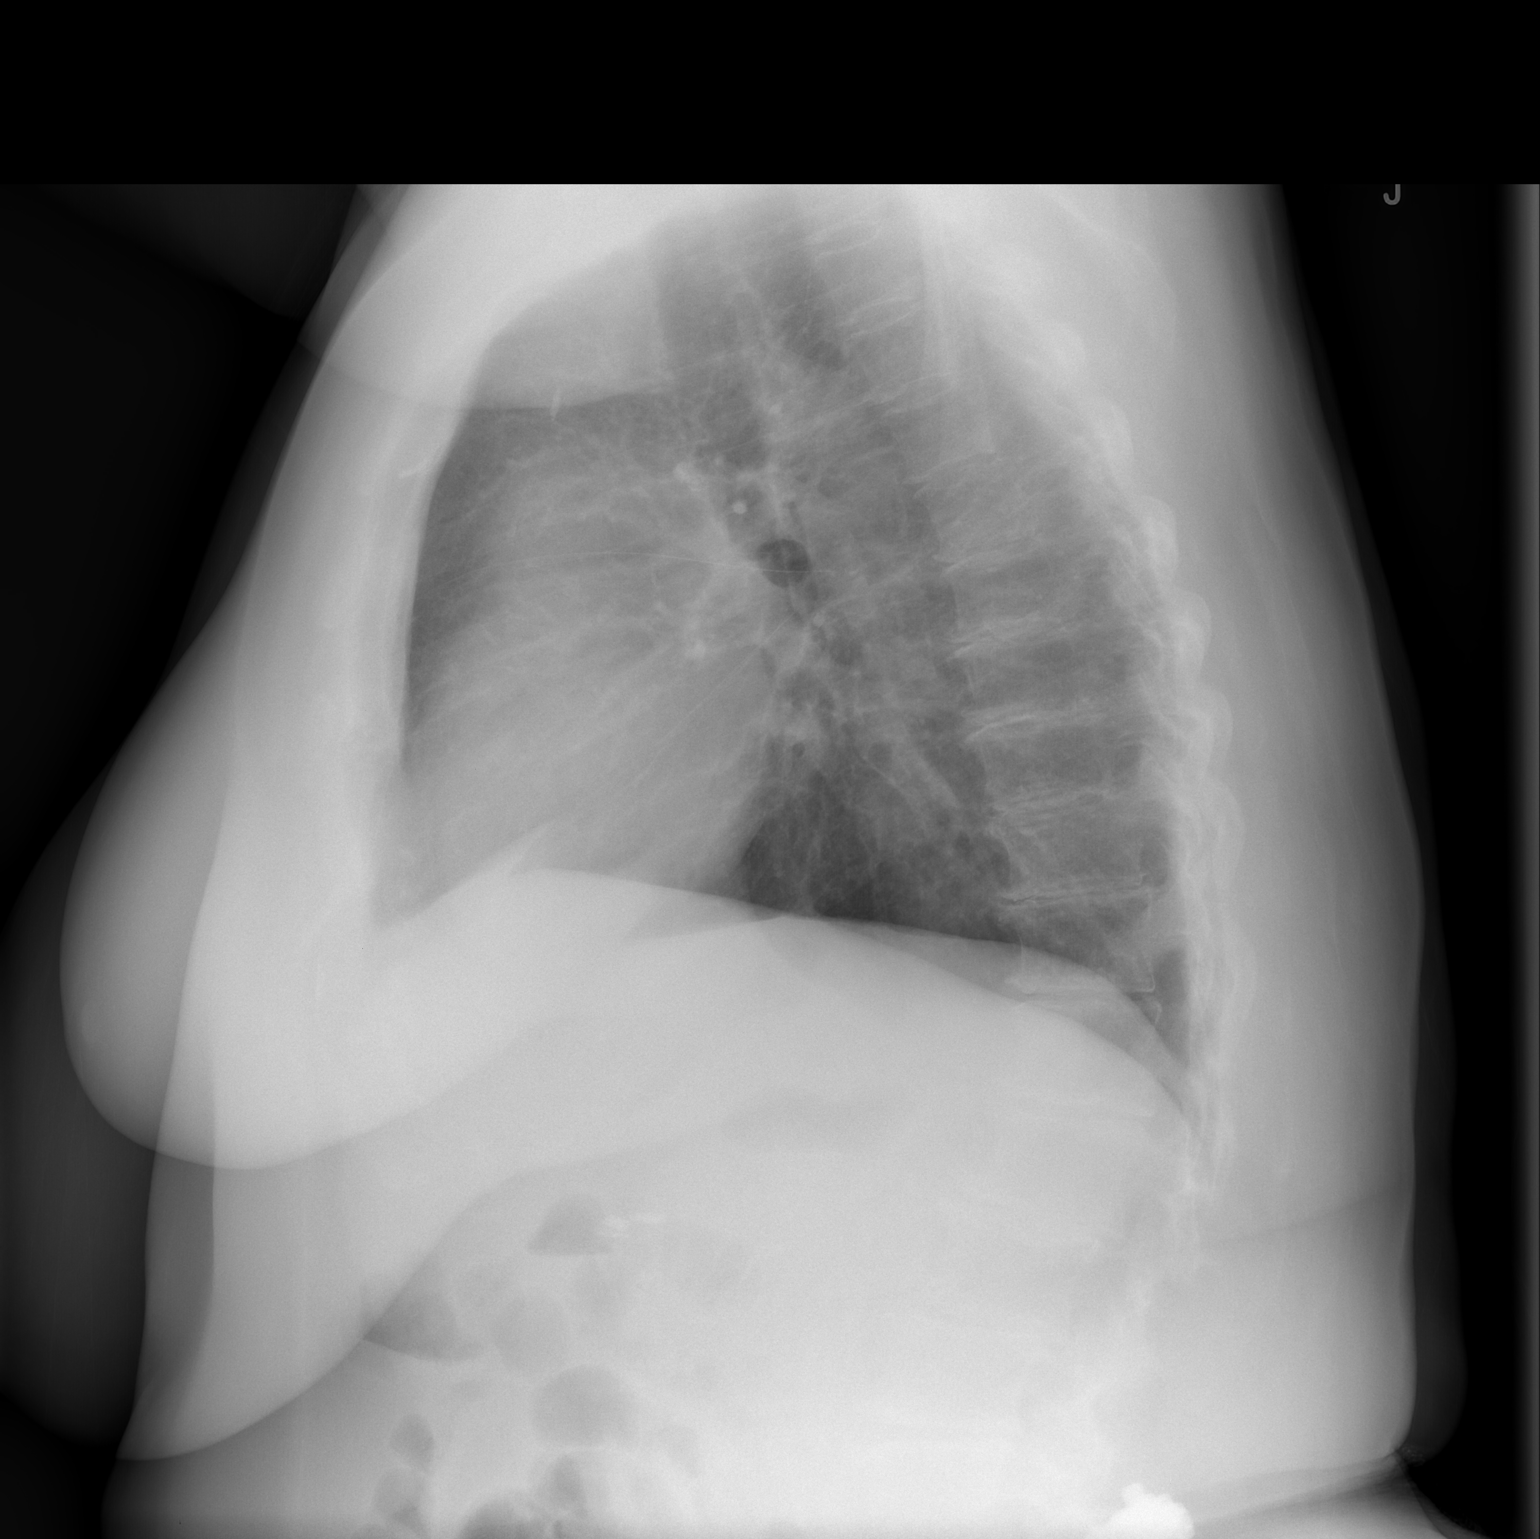

[2 of 2 positions shown; findings below may reference images not displayed]

FINDINGS: The heart size and mediastinal contours are within normal limits.
Both lungs are clear. The visualized skeletal structures are
unremarkable.
IMPRESSION: Normal chest.

## 2016-08-20 DIAGNOSIS — I10 Essential (primary) hypertension: Secondary | ICD-10-CM | POA: Diagnosis not present

## 2016-08-20 DIAGNOSIS — E784 Other hyperlipidemia: Secondary | ICD-10-CM | POA: Diagnosis not present

## 2016-08-20 DIAGNOSIS — E1165 Type 2 diabetes mellitus with hyperglycemia: Secondary | ICD-10-CM | POA: Diagnosis not present

## 2016-11-19 DIAGNOSIS — I10 Essential (primary) hypertension: Secondary | ICD-10-CM | POA: Diagnosis not present

## 2016-11-19 DIAGNOSIS — E1165 Type 2 diabetes mellitus with hyperglycemia: Secondary | ICD-10-CM | POA: Diagnosis not present

## 2016-11-19 DIAGNOSIS — E784 Other hyperlipidemia: Secondary | ICD-10-CM | POA: Diagnosis not present

## 2017-02-17 DIAGNOSIS — I10 Essential (primary) hypertension: Secondary | ICD-10-CM | POA: Diagnosis not present

## 2017-02-17 DIAGNOSIS — E784 Other hyperlipidemia: Secondary | ICD-10-CM | POA: Diagnosis not present

## 2017-02-17 DIAGNOSIS — H81393 Other peripheral vertigo, bilateral: Secondary | ICD-10-CM | POA: Diagnosis not present

## 2017-02-17 DIAGNOSIS — E1165 Type 2 diabetes mellitus with hyperglycemia: Secondary | ICD-10-CM | POA: Diagnosis not present

## 2017-03-09 DIAGNOSIS — E1165 Type 2 diabetes mellitus with hyperglycemia: Secondary | ICD-10-CM | POA: Diagnosis not present

## 2017-03-09 DIAGNOSIS — E784 Other hyperlipidemia: Secondary | ICD-10-CM | POA: Diagnosis not present

## 2017-03-09 DIAGNOSIS — I10 Essential (primary) hypertension: Secondary | ICD-10-CM | POA: Diagnosis not present

## 2017-05-16 DIAGNOSIS — E784 Other hyperlipidemia: Secondary | ICD-10-CM | POA: Diagnosis not present

## 2017-05-16 DIAGNOSIS — I10 Essential (primary) hypertension: Secondary | ICD-10-CM | POA: Diagnosis not present

## 2017-05-16 DIAGNOSIS — Z1389 Encounter for screening for other disorder: Secondary | ICD-10-CM | POA: Diagnosis not present

## 2017-05-16 DIAGNOSIS — Z0000 Encounter for general adult medical examination without abnormal findings: Secondary | ICD-10-CM | POA: Diagnosis not present

## 2017-05-16 DIAGNOSIS — E1165 Type 2 diabetes mellitus with hyperglycemia: Secondary | ICD-10-CM | POA: Diagnosis not present

## 2017-07-11 DIAGNOSIS — I4891 Unspecified atrial fibrillation: Secondary | ICD-10-CM | POA: Diagnosis not present

## 2017-07-11 DIAGNOSIS — Z7984 Long term (current) use of oral hypoglycemic drugs: Secondary | ICD-10-CM | POA: Diagnosis not present

## 2017-07-11 DIAGNOSIS — R197 Diarrhea, unspecified: Secondary | ICD-10-CM | POA: Diagnosis not present

## 2017-07-11 DIAGNOSIS — K5792 Diverticulitis of intestine, part unspecified, without perforation or abscess without bleeding: Secondary | ICD-10-CM | POA: Diagnosis not present

## 2017-07-11 DIAGNOSIS — R112 Nausea with vomiting, unspecified: Secondary | ICD-10-CM | POA: Diagnosis not present

## 2017-07-11 DIAGNOSIS — I10 Essential (primary) hypertension: Secondary | ICD-10-CM | POA: Diagnosis not present

## 2017-07-11 DIAGNOSIS — Z79899 Other long term (current) drug therapy: Secondary | ICD-10-CM | POA: Diagnosis not present

## 2017-07-11 DIAGNOSIS — R1110 Vomiting, unspecified: Secondary | ICD-10-CM | POA: Diagnosis not present

## 2017-07-11 DIAGNOSIS — M545 Low back pain: Secondary | ICD-10-CM | POA: Diagnosis not present

## 2017-07-11 DIAGNOSIS — K5732 Diverticulitis of large intestine without perforation or abscess without bleeding: Secondary | ICD-10-CM | POA: Diagnosis not present

## 2017-07-11 DIAGNOSIS — E1165 Type 2 diabetes mellitus with hyperglycemia: Secondary | ICD-10-CM | POA: Diagnosis not present

## 2017-07-11 DIAGNOSIS — Z888 Allergy status to other drugs, medicaments and biological substances status: Secondary | ICD-10-CM | POA: Diagnosis not present

## 2017-07-11 DIAGNOSIS — Z886 Allergy status to analgesic agent status: Secondary | ICD-10-CM | POA: Diagnosis not present

## 2017-07-11 DIAGNOSIS — E871 Hypo-osmolality and hyponatremia: Secondary | ICD-10-CM | POA: Diagnosis not present

## 2017-07-11 DIAGNOSIS — Z885 Allergy status to narcotic agent status: Secondary | ICD-10-CM | POA: Diagnosis not present

## 2017-07-11 DIAGNOSIS — Z881 Allergy status to other antibiotic agents status: Secondary | ICD-10-CM | POA: Diagnosis not present

## 2017-07-11 DIAGNOSIS — R1030 Lower abdominal pain, unspecified: Secondary | ICD-10-CM | POA: Diagnosis not present

## 2017-07-11 DIAGNOSIS — E785 Hyperlipidemia, unspecified: Secondary | ICD-10-CM | POA: Diagnosis not present

## 2017-07-11 DIAGNOSIS — K449 Diaphragmatic hernia without obstruction or gangrene: Secondary | ICD-10-CM | POA: Diagnosis not present

## 2017-07-11 DIAGNOSIS — E039 Hypothyroidism, unspecified: Secondary | ICD-10-CM | POA: Diagnosis not present

## 2017-07-11 DIAGNOSIS — E038 Other specified hypothyroidism: Secondary | ICD-10-CM | POA: Diagnosis not present

## 2017-07-11 DIAGNOSIS — Z87891 Personal history of nicotine dependence: Secondary | ICD-10-CM | POA: Diagnosis not present

## 2017-07-11 DIAGNOSIS — R911 Solitary pulmonary nodule: Secondary | ICD-10-CM | POA: Diagnosis not present

## 2017-07-11 DIAGNOSIS — R918 Other nonspecific abnormal finding of lung field: Secondary | ICD-10-CM | POA: Diagnosis not present

## 2017-07-11 DIAGNOSIS — Z91018 Allergy to other foods: Secondary | ICD-10-CM | POA: Diagnosis not present

## 2017-07-11 DIAGNOSIS — R079 Chest pain, unspecified: Secondary | ICD-10-CM | POA: Diagnosis not present

## 2017-07-11 DIAGNOSIS — I480 Paroxysmal atrial fibrillation: Secondary | ICD-10-CM | POA: Diagnosis not present

## 2017-07-11 DIAGNOSIS — G8929 Other chronic pain: Secondary | ICD-10-CM | POA: Diagnosis not present

## 2017-07-26 DIAGNOSIS — E1165 Type 2 diabetes mellitus with hyperglycemia: Secondary | ICD-10-CM | POA: Diagnosis not present

## 2017-07-26 DIAGNOSIS — K5730 Diverticulosis of large intestine without perforation or abscess without bleeding: Secondary | ICD-10-CM | POA: Diagnosis not present

## 2017-07-26 DIAGNOSIS — E784 Other hyperlipidemia: Secondary | ICD-10-CM | POA: Diagnosis not present

## 2017-07-26 DIAGNOSIS — I10 Essential (primary) hypertension: Secondary | ICD-10-CM | POA: Diagnosis not present

## 2017-09-26 DIAGNOSIS — E1165 Type 2 diabetes mellitus with hyperglycemia: Secondary | ICD-10-CM | POA: Diagnosis not present

## 2017-09-26 DIAGNOSIS — E7849 Other hyperlipidemia: Secondary | ICD-10-CM | POA: Diagnosis not present

## 2017-09-26 DIAGNOSIS — K5730 Diverticulosis of large intestine without perforation or abscess without bleeding: Secondary | ICD-10-CM | POA: Diagnosis not present

## 2017-09-26 DIAGNOSIS — M545 Low back pain: Secondary | ICD-10-CM | POA: Diagnosis not present

## 2017-09-26 DIAGNOSIS — I10 Essential (primary) hypertension: Secondary | ICD-10-CM | POA: Diagnosis not present

## 2017-11-13 DIAGNOSIS — E039 Hypothyroidism, unspecified: Secondary | ICD-10-CM | POA: Diagnosis not present

## 2017-11-13 DIAGNOSIS — R0602 Shortness of breath: Secondary | ICD-10-CM | POA: Diagnosis not present

## 2017-11-13 DIAGNOSIS — J440 Chronic obstructive pulmonary disease with acute lower respiratory infection: Secondary | ICD-10-CM | POA: Diagnosis not present

## 2017-11-13 DIAGNOSIS — K219 Gastro-esophageal reflux disease without esophagitis: Secondary | ICD-10-CM | POA: Diagnosis not present

## 2017-11-13 DIAGNOSIS — E119 Type 2 diabetes mellitus without complications: Secondary | ICD-10-CM | POA: Diagnosis not present

## 2017-11-13 DIAGNOSIS — J209 Acute bronchitis, unspecified: Secondary | ICD-10-CM | POA: Diagnosis not present

## 2017-11-13 DIAGNOSIS — Z87891 Personal history of nicotine dependence: Secondary | ICD-10-CM | POA: Diagnosis not present

## 2017-11-13 DIAGNOSIS — I160 Hypertensive urgency: Secondary | ICD-10-CM | POA: Diagnosis not present

## 2017-11-13 DIAGNOSIS — R0789 Other chest pain: Secondary | ICD-10-CM | POA: Diagnosis not present

## 2017-11-13 DIAGNOSIS — R197 Diarrhea, unspecified: Secondary | ICD-10-CM | POA: Diagnosis not present

## 2017-11-13 DIAGNOSIS — I10 Essential (primary) hypertension: Secondary | ICD-10-CM | POA: Diagnosis not present

## 2017-11-13 DIAGNOSIS — R531 Weakness: Secondary | ICD-10-CM | POA: Diagnosis not present

## 2017-11-13 DIAGNOSIS — Z79899 Other long term (current) drug therapy: Secondary | ICD-10-CM | POA: Diagnosis not present

## 2017-11-13 DIAGNOSIS — E7800 Pure hypercholesterolemia, unspecified: Secondary | ICD-10-CM | POA: Diagnosis not present

## 2017-11-18 DIAGNOSIS — E119 Type 2 diabetes mellitus without complications: Secondary | ICD-10-CM | POA: Diagnosis not present

## 2017-11-18 DIAGNOSIS — E7849 Other hyperlipidemia: Secondary | ICD-10-CM | POA: Diagnosis not present

## 2017-11-18 DIAGNOSIS — I10 Essential (primary) hypertension: Secondary | ICD-10-CM | POA: Diagnosis not present

## 2017-11-18 DIAGNOSIS — M545 Low back pain: Secondary | ICD-10-CM | POA: Diagnosis not present

## 2017-12-09 DIAGNOSIS — R739 Hyperglycemia, unspecified: Secondary | ICD-10-CM | POA: Diagnosis not present

## 2017-12-09 DIAGNOSIS — R531 Weakness: Secondary | ICD-10-CM | POA: Diagnosis not present

## 2017-12-10 DIAGNOSIS — J449 Chronic obstructive pulmonary disease, unspecified: Secondary | ICD-10-CM | POA: Diagnosis not present

## 2017-12-10 DIAGNOSIS — I10 Essential (primary) hypertension: Secondary | ICD-10-CM | POA: Diagnosis not present

## 2017-12-10 DIAGNOSIS — Z87891 Personal history of nicotine dependence: Secondary | ICD-10-CM | POA: Diagnosis not present

## 2017-12-10 DIAGNOSIS — E7800 Pure hypercholesterolemia, unspecified: Secondary | ICD-10-CM | POA: Diagnosis not present

## 2017-12-10 DIAGNOSIS — K219 Gastro-esophageal reflux disease without esophagitis: Secondary | ICD-10-CM | POA: Diagnosis not present

## 2017-12-10 DIAGNOSIS — Z79899 Other long term (current) drug therapy: Secondary | ICD-10-CM | POA: Diagnosis not present

## 2017-12-10 DIAGNOSIS — E119 Type 2 diabetes mellitus without complications: Secondary | ICD-10-CM | POA: Diagnosis not present

## 2017-12-10 DIAGNOSIS — E039 Hypothyroidism, unspecified: Secondary | ICD-10-CM | POA: Diagnosis not present

## 2017-12-10 DIAGNOSIS — Z7984 Long term (current) use of oral hypoglycemic drugs: Secondary | ICD-10-CM | POA: Diagnosis not present

## 2018-03-01 DIAGNOSIS — E7849 Other hyperlipidemia: Secondary | ICD-10-CM | POA: Diagnosis not present

## 2018-03-01 DIAGNOSIS — M545 Low back pain: Secondary | ICD-10-CM | POA: Diagnosis not present

## 2018-03-01 DIAGNOSIS — I10 Essential (primary) hypertension: Secondary | ICD-10-CM | POA: Diagnosis not present

## 2018-03-01 DIAGNOSIS — E038 Other specified hypothyroidism: Secondary | ICD-10-CM | POA: Diagnosis not present

## 2018-03-01 DIAGNOSIS — Z6834 Body mass index (BMI) 34.0-34.9, adult: Secondary | ICD-10-CM | POA: Diagnosis not present

## 2018-03-01 DIAGNOSIS — E119 Type 2 diabetes mellitus without complications: Secondary | ICD-10-CM | POA: Diagnosis not present

## 2018-03-20 DIAGNOSIS — E038 Other specified hypothyroidism: Secondary | ICD-10-CM | POA: Diagnosis not present

## 2018-03-20 DIAGNOSIS — E7849 Other hyperlipidemia: Secondary | ICD-10-CM | POA: Diagnosis not present

## 2018-03-20 DIAGNOSIS — M545 Low back pain: Secondary | ICD-10-CM | POA: Diagnosis not present

## 2018-03-20 DIAGNOSIS — I10 Essential (primary) hypertension: Secondary | ICD-10-CM | POA: Diagnosis not present

## 2018-03-20 DIAGNOSIS — Z6834 Body mass index (BMI) 34.0-34.9, adult: Secondary | ICD-10-CM | POA: Diagnosis not present

## 2018-03-20 DIAGNOSIS — E119 Type 2 diabetes mellitus without complications: Secondary | ICD-10-CM | POA: Diagnosis not present

## 2018-04-18 DIAGNOSIS — H524 Presbyopia: Secondary | ICD-10-CM | POA: Diagnosis not present

## 2018-05-01 DIAGNOSIS — Z6834 Body mass index (BMI) 34.0-34.9, adult: Secondary | ICD-10-CM | POA: Diagnosis not present

## 2018-05-01 DIAGNOSIS — E7849 Other hyperlipidemia: Secondary | ICD-10-CM | POA: Diagnosis not present

## 2018-05-01 DIAGNOSIS — E038 Other specified hypothyroidism: Secondary | ICD-10-CM | POA: Diagnosis not present

## 2018-05-01 DIAGNOSIS — E119 Type 2 diabetes mellitus without complications: Secondary | ICD-10-CM | POA: Diagnosis not present

## 2018-05-01 DIAGNOSIS — M545 Low back pain: Secondary | ICD-10-CM | POA: Diagnosis not present

## 2018-05-01 DIAGNOSIS — I10 Essential (primary) hypertension: Secondary | ICD-10-CM | POA: Diagnosis not present

## 2018-05-17 ENCOUNTER — Encounter (INDEPENDENT_AMBULATORY_CARE_PROVIDER_SITE_OTHER): Payer: Medicare HMO | Admitting: Ophthalmology

## 2018-05-17 DIAGNOSIS — E113311 Type 2 diabetes mellitus with moderate nonproliferative diabetic retinopathy with macular edema, right eye: Secondary | ICD-10-CM

## 2018-05-17 DIAGNOSIS — I10 Essential (primary) hypertension: Secondary | ICD-10-CM | POA: Diagnosis not present

## 2018-05-17 DIAGNOSIS — H353211 Exudative age-related macular degeneration, right eye, with active choroidal neovascularization: Secondary | ICD-10-CM | POA: Diagnosis not present

## 2018-05-17 DIAGNOSIS — H43813 Vitreous degeneration, bilateral: Secondary | ICD-10-CM

## 2018-05-17 DIAGNOSIS — E11311 Type 2 diabetes mellitus with unspecified diabetic retinopathy with macular edema: Secondary | ICD-10-CM | POA: Diagnosis not present

## 2018-05-17 DIAGNOSIS — H353122 Nonexudative age-related macular degeneration, left eye, intermediate dry stage: Secondary | ICD-10-CM | POA: Diagnosis not present

## 2018-05-17 DIAGNOSIS — H35033 Hypertensive retinopathy, bilateral: Secondary | ICD-10-CM | POA: Diagnosis not present

## 2018-05-31 DIAGNOSIS — Z6834 Body mass index (BMI) 34.0-34.9, adult: Secondary | ICD-10-CM | POA: Diagnosis not present

## 2018-05-31 DIAGNOSIS — J40 Bronchitis, not specified as acute or chronic: Secondary | ICD-10-CM | POA: Diagnosis not present

## 2018-06-12 ENCOUNTER — Encounter (INDEPENDENT_AMBULATORY_CARE_PROVIDER_SITE_OTHER): Payer: Self-pay | Admitting: Ophthalmology

## 2018-06-29 DIAGNOSIS — Z0000 Encounter for general adult medical examination without abnormal findings: Secondary | ICD-10-CM | POA: Diagnosis not present

## 2018-06-29 DIAGNOSIS — Z6834 Body mass index (BMI) 34.0-34.9, adult: Secondary | ICD-10-CM | POA: Diagnosis not present

## 2018-07-25 DIAGNOSIS — Z1231 Encounter for screening mammogram for malignant neoplasm of breast: Secondary | ICD-10-CM | POA: Diagnosis not present

## 2018-08-16 DIAGNOSIS — Z6834 Body mass index (BMI) 34.0-34.9, adult: Secondary | ICD-10-CM | POA: Diagnosis not present

## 2018-08-16 DIAGNOSIS — K0510 Chronic gingivitis, plaque induced: Secondary | ICD-10-CM | POA: Diagnosis not present

## 2018-08-29 DIAGNOSIS — Z0000 Encounter for general adult medical examination without abnormal findings: Secondary | ICD-10-CM | POA: Diagnosis not present

## 2018-08-29 DIAGNOSIS — E038 Other specified hypothyroidism: Secondary | ICD-10-CM | POA: Diagnosis not present

## 2018-08-29 DIAGNOSIS — Z1389 Encounter for screening for other disorder: Secondary | ICD-10-CM | POA: Diagnosis not present

## 2018-08-29 DIAGNOSIS — K0510 Chronic gingivitis, plaque induced: Secondary | ICD-10-CM | POA: Diagnosis not present

## 2018-08-29 DIAGNOSIS — I10 Essential (primary) hypertension: Secondary | ICD-10-CM | POA: Diagnosis not present

## 2018-08-29 DIAGNOSIS — Z6834 Body mass index (BMI) 34.0-34.9, adult: Secondary | ICD-10-CM | POA: Diagnosis not present

## 2018-08-29 DIAGNOSIS — I5032 Chronic diastolic (congestive) heart failure: Secondary | ICD-10-CM | POA: Diagnosis not present

## 2018-08-29 DIAGNOSIS — K210 Gastro-esophageal reflux disease with esophagitis: Secondary | ICD-10-CM | POA: Diagnosis not present

## 2018-08-29 DIAGNOSIS — E11610 Type 2 diabetes mellitus with diabetic neuropathic arthropathy: Secondary | ICD-10-CM | POA: Diagnosis not present

## 2018-08-29 DIAGNOSIS — E782 Mixed hyperlipidemia: Secondary | ICD-10-CM | POA: Diagnosis not present

## 2018-09-05 DIAGNOSIS — M810 Age-related osteoporosis without current pathological fracture: Secondary | ICD-10-CM | POA: Diagnosis not present

## 2018-11-30 DIAGNOSIS — I10 Essential (primary) hypertension: Secondary | ICD-10-CM | POA: Diagnosis not present

## 2018-11-30 DIAGNOSIS — E11610 Type 2 diabetes mellitus with diabetic neuropathic arthropathy: Secondary | ICD-10-CM | POA: Diagnosis not present

## 2018-11-30 DIAGNOSIS — E782 Mixed hyperlipidemia: Secondary | ICD-10-CM | POA: Diagnosis not present

## 2018-11-30 DIAGNOSIS — K210 Gastro-esophageal reflux disease with esophagitis: Secondary | ICD-10-CM | POA: Diagnosis not present

## 2018-11-30 DIAGNOSIS — E038 Other specified hypothyroidism: Secondary | ICD-10-CM | POA: Diagnosis not present

## 2018-11-30 DIAGNOSIS — I5032 Chronic diastolic (congestive) heart failure: Secondary | ICD-10-CM | POA: Diagnosis not present

## 2018-11-30 DIAGNOSIS — Z6834 Body mass index (BMI) 34.0-34.9, adult: Secondary | ICD-10-CM | POA: Diagnosis not present

## 2019-01-23 DIAGNOSIS — L0390 Cellulitis, unspecified: Secondary | ICD-10-CM | POA: Diagnosis not present

## 2019-01-23 DIAGNOSIS — Z6834 Body mass index (BMI) 34.0-34.9, adult: Secondary | ICD-10-CM | POA: Diagnosis not present

## 2019-03-14 DIAGNOSIS — E038 Other specified hypothyroidism: Secondary | ICD-10-CM | POA: Diagnosis not present

## 2019-03-14 DIAGNOSIS — E785 Hyperlipidemia, unspecified: Secondary | ICD-10-CM | POA: Diagnosis not present

## 2019-03-14 DIAGNOSIS — Z6834 Body mass index (BMI) 34.0-34.9, adult: Secondary | ICD-10-CM | POA: Diagnosis not present

## 2019-03-14 DIAGNOSIS — E1121 Type 2 diabetes mellitus with diabetic nephropathy: Secondary | ICD-10-CM | POA: Diagnosis not present

## 2019-03-14 DIAGNOSIS — Z6833 Body mass index (BMI) 33.0-33.9, adult: Secondary | ICD-10-CM | POA: Diagnosis not present

## 2019-03-14 DIAGNOSIS — I10 Essential (primary) hypertension: Secondary | ICD-10-CM | POA: Diagnosis not present

## 2019-03-14 DIAGNOSIS — L0390 Cellulitis, unspecified: Secondary | ICD-10-CM | POA: Diagnosis not present

## 2019-04-19 DIAGNOSIS — F3131 Bipolar disorder, current episode depressed, mild: Secondary | ICD-10-CM | POA: Diagnosis not present

## 2019-04-19 DIAGNOSIS — Z6831 Body mass index (BMI) 31.0-31.9, adult: Secondary | ICD-10-CM | POA: Diagnosis not present

## 2019-05-12 DIAGNOSIS — J449 Chronic obstructive pulmonary disease, unspecified: Secondary | ICD-10-CM | POA: Diagnosis not present

## 2019-05-12 DIAGNOSIS — I700 Atherosclerosis of aorta: Secondary | ICD-10-CM | POA: Diagnosis not present

## 2019-05-12 DIAGNOSIS — E119 Type 2 diabetes mellitus without complications: Secondary | ICD-10-CM | POA: Diagnosis not present

## 2019-05-12 DIAGNOSIS — K219 Gastro-esophageal reflux disease without esophagitis: Secondary | ICD-10-CM | POA: Diagnosis not present

## 2019-05-12 DIAGNOSIS — I10 Essential (primary) hypertension: Secondary | ICD-10-CM | POA: Diagnosis not present

## 2019-05-12 DIAGNOSIS — Z79899 Other long term (current) drug therapy: Secondary | ICD-10-CM | POA: Diagnosis not present

## 2019-05-12 DIAGNOSIS — E7800 Pure hypercholesterolemia, unspecified: Secondary | ICD-10-CM | POA: Diagnosis not present

## 2019-05-12 DIAGNOSIS — E039 Hypothyroidism, unspecified: Secondary | ICD-10-CM | POA: Diagnosis not present

## 2019-05-12 DIAGNOSIS — R079 Chest pain, unspecified: Secondary | ICD-10-CM | POA: Diagnosis not present

## 2019-05-12 DIAGNOSIS — S20211A Contusion of right front wall of thorax, initial encounter: Secondary | ICD-10-CM | POA: Diagnosis not present

## 2019-05-20 DIAGNOSIS — M1990 Unspecified osteoarthritis, unspecified site: Secondary | ICD-10-CM | POA: Diagnosis not present

## 2019-05-20 DIAGNOSIS — R21 Rash and other nonspecific skin eruption: Secondary | ICD-10-CM | POA: Diagnosis not present

## 2019-05-20 DIAGNOSIS — E039 Hypothyroidism, unspecified: Secondary | ICD-10-CM | POA: Diagnosis not present

## 2019-05-20 DIAGNOSIS — F17200 Nicotine dependence, unspecified, uncomplicated: Secondary | ICD-10-CM | POA: Diagnosis not present

## 2019-05-20 DIAGNOSIS — M79672 Pain in left foot: Secondary | ICD-10-CM | POA: Diagnosis not present

## 2019-05-20 DIAGNOSIS — E119 Type 2 diabetes mellitus without complications: Secondary | ICD-10-CM | POA: Diagnosis not present

## 2019-05-20 DIAGNOSIS — M79671 Pain in right foot: Secondary | ICD-10-CM | POA: Diagnosis not present

## 2019-05-20 DIAGNOSIS — E7800 Pure hypercholesterolemia, unspecified: Secondary | ICD-10-CM | POA: Diagnosis not present

## 2019-05-20 DIAGNOSIS — K219 Gastro-esophageal reflux disease without esophagitis: Secondary | ICD-10-CM | POA: Diagnosis not present

## 2019-05-20 DIAGNOSIS — T50905A Adverse effect of unspecified drugs, medicaments and biological substances, initial encounter: Secondary | ICD-10-CM | POA: Diagnosis not present

## 2019-05-20 DIAGNOSIS — I776 Arteritis, unspecified: Secondary | ICD-10-CM | POA: Diagnosis not present

## 2019-05-20 DIAGNOSIS — I10 Essential (primary) hypertension: Secondary | ICD-10-CM | POA: Diagnosis not present

## 2019-05-24 DIAGNOSIS — M545 Low back pain: Secondary | ICD-10-CM | POA: Diagnosis not present

## 2019-05-24 DIAGNOSIS — J449 Chronic obstructive pulmonary disease, unspecified: Secondary | ICD-10-CM | POA: Diagnosis not present

## 2019-05-24 DIAGNOSIS — Z6831 Body mass index (BMI) 31.0-31.9, adult: Secondary | ICD-10-CM | POA: Diagnosis not present

## 2019-06-05 DIAGNOSIS — Z6830 Body mass index (BMI) 30.0-30.9, adult: Secondary | ICD-10-CM | POA: Diagnosis not present

## 2019-06-05 DIAGNOSIS — L279 Dermatitis due to unspecified substance taken internally: Secondary | ICD-10-CM | POA: Diagnosis not present

## 2019-06-13 DIAGNOSIS — E119 Type 2 diabetes mellitus without complications: Secondary | ICD-10-CM | POA: Diagnosis not present

## 2019-06-13 DIAGNOSIS — E1165 Type 2 diabetes mellitus with hyperglycemia: Secondary | ICD-10-CM | POA: Diagnosis not present

## 2019-06-13 DIAGNOSIS — I10 Essential (primary) hypertension: Secondary | ICD-10-CM | POA: Diagnosis not present

## 2019-06-13 DIAGNOSIS — K2970 Gastritis, unspecified, without bleeding: Secondary | ICD-10-CM | POA: Diagnosis not present

## 2019-06-13 DIAGNOSIS — Z6831 Body mass index (BMI) 31.0-31.9, adult: Secondary | ICD-10-CM | POA: Diagnosis not present

## 2019-06-28 DIAGNOSIS — Z6831 Body mass index (BMI) 31.0-31.9, adult: Secondary | ICD-10-CM | POA: Diagnosis not present

## 2019-06-28 DIAGNOSIS — M545 Low back pain: Secondary | ICD-10-CM | POA: Diagnosis not present

## 2019-07-04 ENCOUNTER — Emergency Department (HOSPITAL_COMMUNITY)
Admission: EM | Admit: 2019-07-04 | Discharge: 2019-07-05 | Disposition: A | Discharge status: Home / Self Care | Payer: Medicare HMO | Attending: Emergency Medicine | Admitting: Emergency Medicine

## 2019-07-04 ENCOUNTER — Encounter (HOSPITAL_COMMUNITY): Payer: Self-pay

## 2019-07-04 ENCOUNTER — Emergency Department (HOSPITAL_COMMUNITY): Payer: Medicare HMO

## 2019-07-04 DIAGNOSIS — I110 Hypertensive heart disease with heart failure: Secondary | ICD-10-CM | POA: Diagnosis not present

## 2019-07-04 DIAGNOSIS — Z96652 Presence of left artificial knee joint: Secondary | ICD-10-CM | POA: Insufficient documentation

## 2019-07-04 DIAGNOSIS — R52 Pain, unspecified: Secondary | ICD-10-CM | POA: Diagnosis not present

## 2019-07-04 DIAGNOSIS — E119 Type 2 diabetes mellitus without complications: Secondary | ICD-10-CM | POA: Insufficient documentation

## 2019-07-04 DIAGNOSIS — I509 Heart failure, unspecified: Secondary | ICD-10-CM | POA: Diagnosis not present

## 2019-07-04 DIAGNOSIS — M546 Pain in thoracic spine: Secondary | ICD-10-CM | POA: Diagnosis not present

## 2019-07-04 DIAGNOSIS — M549 Dorsalgia, unspecified: Secondary | ICD-10-CM | POA: Diagnosis not present

## 2019-07-04 DIAGNOSIS — Z79899 Other long term (current) drug therapy: Secondary | ICD-10-CM | POA: Diagnosis not present

## 2019-07-04 DIAGNOSIS — I10 Essential (primary) hypertension: Secondary | ICD-10-CM | POA: Diagnosis not present

## 2019-07-04 DIAGNOSIS — J449 Chronic obstructive pulmonary disease, unspecified: Secondary | ICD-10-CM | POA: Diagnosis not present

## 2019-07-04 DIAGNOSIS — M5489 Other dorsalgia: Secondary | ICD-10-CM | POA: Diagnosis not present

## 2019-07-04 DIAGNOSIS — F17210 Nicotine dependence, cigarettes, uncomplicated: Secondary | ICD-10-CM | POA: Insufficient documentation

## 2019-07-04 DIAGNOSIS — E039 Hypothyroidism, unspecified: Secondary | ICD-10-CM | POA: Diagnosis not present

## 2019-07-04 DIAGNOSIS — M545 Low back pain: Secondary | ICD-10-CM | POA: Diagnosis not present

## 2019-07-04 HISTORY — DX: Heart failure, unspecified: I50.9

## 2019-07-04 LAB — CBC WITH DIFFERENTIAL/PLATELET
Abs Immature Granulocytes: 0.06 10*3/uL (ref 0.00–0.07)
Basophils Absolute: 0.1 10*3/uL (ref 0.0–0.1)
Basophils Relative: 1 %
Eosinophils Absolute: 0.3 10*3/uL (ref 0.0–0.5)
Eosinophils Relative: 2 %
HCT: 38.1 % (ref 36.0–46.0)
Hemoglobin: 13.4 g/dL (ref 12.0–15.0)
Immature Granulocytes: 1 %
Lymphocytes Relative: 23 %
Lymphs Abs: 2.5 10*3/uL (ref 0.7–4.0)
MCH: 29.8 pg (ref 26.0–34.0)
MCHC: 35.2 g/dL (ref 30.0–36.0)
MCV: 84.9 fL (ref 80.0–100.0)
Monocytes Absolute: 0.8 10*3/uL (ref 0.1–1.0)
Monocytes Relative: 7 %
Neutro Abs: 7.2 10*3/uL (ref 1.7–7.7)
Neutrophils Relative %: 66 %
Platelets: 256 10*3/uL (ref 150–400)
RBC: 4.49 MIL/uL (ref 3.87–5.11)
RDW: 14.5 % (ref 11.5–15.5)
WBC: 10.8 10*3/uL — ABNORMAL HIGH (ref 4.0–10.5)
nRBC: 0 % (ref 0.0–0.2)

## 2019-07-04 LAB — BASIC METABOLIC PANEL
Anion gap: 11 (ref 5–15)
BUN: 7 mg/dL — ABNORMAL LOW (ref 8–23)
CO2: 25 mmol/L (ref 22–32)
Calcium: 9.2 mg/dL (ref 8.9–10.3)
Chloride: 88 mmol/L — ABNORMAL LOW (ref 98–111)
Creatinine, Ser: 0.63 mg/dL (ref 0.44–1.00)
GFR calc Af Amer: >60 mL/min (ref >60)
GFR calc non Af Amer: >60 mL/min (ref >60)
Glucose, Bld: 180 mg/dL — ABNORMAL HIGH (ref 70–99)
Potassium: 3.1 mmol/L — ABNORMAL LOW (ref 3.5–5.1)
Sodium: 124 mmol/L — ABNORMAL LOW (ref 135–145)

## 2019-07-04 MED ORDER — HYDROMORPHONE HCL 1 MG/ML IJ SOLN
0.5000 mg | Freq: Once | INTRAMUSCULAR | Status: AC
  Administered 2019-07-04: 0.5 mg via INTRAVENOUS
  Filled 2019-07-04: qty 1

## 2019-07-04 MED ORDER — SODIUM CHLORIDE 0.9 % IV SOLN
INTRAVENOUS | Status: DC
  Administered 2019-07-04: 19:00:00 via INTRAVENOUS

## 2019-07-04 MED ORDER — LORAZEPAM 2 MG/ML IJ SOLN
0.5000 mg | Freq: Once | INTRAMUSCULAR | Status: AC
  Administered 2019-07-04: 0.5 mg via INTRAVENOUS
  Filled 2019-07-04: qty 1

## 2019-07-04 MED ORDER — MELOXICAM 7.5 MG PO TABS: 7.5000 mg | ORAL_TABLET | Freq: Every day | ORAL | 0 refills | Status: DC | PRN

## 2019-07-04 MED ORDER — KETOROLAC TROMETHAMINE 30 MG/ML IJ SOLN
15.0000 mg | Freq: Once | INTRAMUSCULAR | Status: AC
  Administered 2019-07-04: 15 mg via INTRAVENOUS
  Filled 2019-07-04: qty 1

## 2019-07-04 NOTE — ED Provider Notes (Signed)
[facility] Provider Note   CSN: [id number] Arrival date & time: 07/04/19  1435     History   Chief Complaint Chief Complaint  Patient presents with  . Back Pain    HPI Mary Moody is a 78 y.o. female.      HPI   78 year old female with back pain.  Mid to lower thoracic region somewhat worse on the right side.  Onset about 2 weeks ago after picking up her dog.  Pain has been persistent since.  Worse with movement.  She reports that she saw her PCP and was given a cortisone injection without improvement.  She has been taking Flexeril and Tylenol which has not helped much.  No urinary complaints.  No acute respiratory complaints.  No numbness or tingling.  She reports prior back surgery but no history of chronic back pain.  Past Medical History:  Diagnosis Date  . Anxiety   . CHF (congestive heart failure) ([place])   . Chronic low back pain   . COPD (chronic obstructive pulmonary disease) ([place])   . Diabetes mellitus without complication ([place])    type II   . Dislocation of right hip ([place])    03/2013   . H/O hiatal hernia   . Hyperlipidemia   . Hypertension   . Hypothyroidism   . Incontinence of urine   . Thyroid disease   . Transfusion history    2 units last 7'14 s/p hip surgery    Patient Active Problem List   Diagnosis Date Noted  . Bladder spasms 07/11/2014  . Periumbilical hernia 72/07/4708  . S/P left TKA 07/02/2014  . S/P right TH revision 05/23/2013  . Expected blood loss anemia 05/23/2013  . Obese 05/23/2013  . Hyponatremia 05/23/2013    Past Surgical History:  Procedure Laterality Date  . ABDOMINAL HYSTERECTOMY    . ANKLE FRACTURE SURGERY    . BACK SURGERY     with plate placement   . CHOLECYSTECTOMY    . DILATION AND CURETTAGE OF UTERUS    . JOINT REPLACEMENT  03/13/2013   right hip replacement  . TOTAL HIP REVISION Right 05/22/2013   Procedure: REVISION RIGHT TOTAL HIP ARTHROPLASTY;  Surgeon: Mauri Pole, MD;  Location:  [facility];  Service: Orthopedics;  Laterality: Right;  . TOTAL KNEE ARTHROPLASTY Left 07/02/2014   Procedure: LEFT TOTAL KNEE ARTHROPLASTY;  Surgeon: Mauri Pole, MD;  Location: [facility];  Service: Orthopedics;  Laterality: Left;     OB History   No obstetric history on file.      Home Medications    Prior to Admission medications   Medication Sig Start Date End Date Taking? Authorizing Provider  beta carotene w/minerals (OCUVITE) tablet Take 1 tablet by mouth daily.    [provider]  busPIRone (BUSPAR) 30 MG tablet Take 30 mg by mouth 2 (two) times daily.    [provider]  calcium-vitamin D (OSCAL WITH D) 500-200 MG-UNIT per tablet Take 1 tablet by mouth 3 (three) times daily.    [provider]  Cholecalciferol (VITAMIN D-3) 1000 UNITS CAPS Take 1,000 Units by mouth daily.     [provider]  ciprofloxacin (CIPRO) 500 MG tablet Take 1 tablet (500 mg total) by mouth 2 (two) times daily. 07/04/14   Danae Orleans, PA-C  cyclobenzaprine (FLEXERIL) 10 MG tablet Take 1 tablet (10 mg total) by mouth 3 (three) times daily as needed for muscle spasms. 07/04/14   Danae Orleans, PA-C  darifenacin (ENABLEX) 15 MG 24 hr tablet Take 15 mg by mouth daily.    [provider]  docusate sodium (COLACE) 100 MG capsule Take 100 mg by mouth 2 (two) times daily.    [provider]  ferrous sulfate 325 (65 FE) MG tablet Take 325 mg by mouth daily with breakfast.    [provider]  gemfibrozil (LOPID) 600 MG tablet Take 600 mg by mouth 2 (two) times daily before a meal.    [provider]  glimepiride (AMARYL) 2 MG tablet Take 2 mg by mouth daily before breakfast.    [provider]  hydrochlorothiazide (HYDRODIURIL) 25 MG tablet Take 25 mg by mouth every morning.    [provider]  isosorbide mononitrate (IMDUR) 30 MG 24 hr tablet Take 30 mg by mouth every morning.    [provider]  labetalol (NORMODYNE)  200 MG tablet Take 200 mg by mouth every morning.     [provider]  levothyroxine (SYNTHROID, LEVOTHROID) 100 MCG tablet Take 100 mcg by mouth daily before breakfast.    [provider]  lisinopril (PRINIVIL,ZESTRIL) 10 MG tablet Take 5 mg by mouth every morning.     [provider]  Melatonin 3 MG TABS Take 3 mg by mouth at bedtime as needed (for sleep).    [provider]  Multiple Vitamin (MULTIVITAMIN) tablet Take 1 tablet by mouth daily.    [provider]  oxybutynin (DITROPAN) 5 MG tablet Take 5 mg by mouth daily.    [provider]  oxyCODONE (OXY IR/ROXICODONE) 5 MG immediate release tablet Take one to three tablets by mouth every 4 hours as needed for severe pain 07/09/14   Estill Dooms, MD  pantoprazole (PROTONIX) 40 MG tablet Take 40 mg by mouth daily.    [provider]  polyethylene glycol (MIRALAX / GLYCOLAX) packet Take 17 g by mouth 2 (two) times daily. 07/04/14   Danae Orleans, PA-C  simvastatin (ZOCOR) 20 MG tablet Take 20 mg by mouth daily.    [provider]    Family History No family history on file.  Social History Social History   Tobacco Use  . Smoking status: Current Every Day Smoker    Packs/day: 0.50    Types: Cigarettes  . Tobacco comment: 06-26-14  now smoking electronic cigs  Substance Use Topics  . Alcohol use: No  . Drug use: No     Allergies   Antivert [meclizine hcl], Erythromycin, Codeine, and Coumadin [warfarin sodium]   Review of Systems Review of Systems  All systems reviewed and negative, other than as noted in HPI.  Physical Exam Updated Vital Signs BP 140/80 (BP Location: Left Arm)   Pulse 89   Temp 98.4 F (36.9 C) (Oral)   Resp 20   Ht 5\' 3"  (1.6 m)   Wt 75.3 kg   SpO2 99%   BMI 29.41 kg/m   Physical Exam Vitals signs and nursing note reviewed.  Constitutional:      General: She is not in acute distress.    Appearance: She is well-developed.   HENT:     Head: Normocephalic and atraumatic.  Eyes:     General:        Right eye: No discharge.        Left eye: No discharge.     Conjunctiva/sclera: Conjunctivae normal.  Neck:     Musculoskeletal: Neck supple.  Cardiovascular:     Rate and Rhythm: Normal rate  and regular rhythm.     Heart sounds: Normal heart sounds. No murmur. No friction rub. No gallop.   Pulmonary:     Effort: Pulmonary effort is normal. No respiratory distress.     Breath sounds: Normal breath sounds.  Abdominal:     General: There is no distension.     Palpations: Abdomen is soft.     Tenderness: There is no abdominal tenderness.  Musculoskeletal:        General: Tenderness present.     Comments: Tenderness in the mid to lower thoracic region paraspinally on the right.  No midline spinal tenderness.  No concerning skin changes.  Strength is normal lower extremities with normal sensation and good patellar reflexes.  Skin:    General: Skin is warm and dry.  Neurological:     Mental Status: She is alert.  Psychiatric:        Behavior: Behavior normal.        Thought Content: Thought content normal.      ED Treatments / Results  Labs (all labs ordered are listed, but only abnormal results are displayed) Labs Reviewed  CBC WITH DIFFERENTIAL/PLATELET - Abnormal; Notable for the following components:      Result Value   WBC 10.8 (*)    All other components within normal limits  BASIC METABOLIC PANEL - Abnormal; Notable for the following components:   Sodium 124 (*)    Potassium 3.1 (*)    Chloride 88 (*)    Glucose, Bld 180 (*)    BUN 7 (*)    All other components within normal limits  URINALYSIS, ROUTINE W REFLEX MICROSCOPIC    EKG None  Radiology No results found.  Procedures Procedures (including critical care time)  Medications Ordered in ED Medications  0.9 %  sodium chloride infusion ( Intravenous New Bag/Given 07/04/19 1917)  HYDROmorphone (DILAUDID) injection 0.5 mg (0.5 mg  Intravenous Given 07/04/19 1918)  ketorolac (TORADOL) 30 MG/ML injection 15 mg (15 mg Intravenous Given 07/04/19 1917)  LORazepam (ATIVAN) injection 0.5 mg (0.5 mg Intravenous Given 07/04/19 1919)     Initial Impression / Assessment and Plan / ED Course  I have reviewed the triage vital signs and the nursing notes.  Pertinent labs & imaging results that were available during my care of the patient were reviewed by me and considered in my medical decision making (see chart for details).    78yF with what I likely MSK back pain. Now much improved after meds in the ED. Neuro exam is nonfocal. UA pending. Symptomatic tx otherwise.    Final Clinical Impressions(s) / ED Diagnoses   Final diagnoses:  Mid back pain    ED Discharge Orders    None       Virgel Manifold, MD 07/13/19 1520

## 2019-07-04 NOTE — ED Notes (Signed)
Patient assisted to bedside commode. Patient informed to throw tissue paper in trash can not the bedside. Patient through tissue into the bedside anyway. Unable to get a urine sample at this time. RN aware.

## 2019-07-04 NOTE — ED Triage Notes (Signed)
Pt brought in by EMS. Picked up dog and hurt right side of back. Received cortisone shot last Thursday. Pain radiates across back

## 2019-07-05 LAB — URINALYSIS, ROUTINE W REFLEX MICROSCOPIC
Glucose, UA: NEGATIVE mg/dL
Hgb urine dipstick: NEGATIVE
Ketones, ur: NEGATIVE mg/dL
Nitrite: NEGATIVE
Protein, ur: 100 mg/dL — AB
Specific Gravity, Urine: 1.021 (ref 1.005–1.030)
pH: 5 (ref 5.0–8.0)

## 2019-07-05 MED ORDER — MELOXICAM 7.5 MG PO TABS: 7.5000 mg | ORAL_TABLET | Freq: Every day | ORAL | 0 refills | Status: AC | PRN

## 2019-07-05 MED ORDER — CEPHALEXIN 500 MG PO CAPS: 500.0000 mg | ORAL_CAPSULE | Freq: Two times a day (BID) | ORAL | 0 refills | Status: AC

## 2019-07-05 NOTE — ED Provider Notes (Signed)
Patient signed out to me to follow-up on urinalysis.  Patient presented with 2-week history of what sounds like musculoskeletal back pain after bending over and picking up her dog.  Work-up for Dr. Lajuan Lines was negative but urinalysis was pending.  Urinalysis equivocal, possible infection present.  Will culture and empirically start on Keflex.   Orpah Greek, MD 07/05/19 [phone]

## 2019-07-06 LAB — URINE CULTURE: Culture: <10000 — AB

## 2019-07-20 DIAGNOSIS — I10 Essential (primary) hypertension: Secondary | ICD-10-CM | POA: Diagnosis not present

## 2019-07-20 DIAGNOSIS — M48061 Spinal stenosis, lumbar region without neurogenic claudication: Secondary | ICD-10-CM | POA: Diagnosis not present

## 2019-07-20 DIAGNOSIS — R52 Pain, unspecified: Secondary | ICD-10-CM | POA: Diagnosis not present

## 2019-07-20 DIAGNOSIS — R000 Tachycardia, unspecified: Secondary | ICD-10-CM | POA: Diagnosis not present

## 2019-07-20 DIAGNOSIS — S39012A Strain of muscle, fascia and tendon of lower back, initial encounter: Secondary | ICD-10-CM | POA: Diagnosis not present

## 2019-07-20 DIAGNOSIS — M5489 Other dorsalgia: Secondary | ICD-10-CM | POA: Diagnosis not present

## 2019-07-20 DIAGNOSIS — X509XXA Other and unspecified overexertion or strenuous movements or postures, initial encounter: Secondary | ICD-10-CM | POA: Diagnosis not present

## 2019-07-21 DIAGNOSIS — J449 Chronic obstructive pulmonary disease, unspecified: Secondary | ICD-10-CM | POA: Diagnosis not present

## 2019-07-21 DIAGNOSIS — M48061 Spinal stenosis, lumbar region without neurogenic claudication: Secondary | ICD-10-CM | POA: Diagnosis not present

## 2019-07-21 DIAGNOSIS — E119 Type 2 diabetes mellitus without complications: Secondary | ICD-10-CM | POA: Diagnosis not present

## 2019-07-21 DIAGNOSIS — X509XXA Other and unspecified overexertion or strenuous movements or postures, initial encounter: Secondary | ICD-10-CM | POA: Diagnosis not present

## 2019-07-21 DIAGNOSIS — M5135 Other intervertebral disc degeneration, thoracolumbar region: Secondary | ICD-10-CM | POA: Diagnosis not present

## 2019-07-21 DIAGNOSIS — F17200 Nicotine dependence, unspecified, uncomplicated: Secondary | ICD-10-CM | POA: Diagnosis not present

## 2019-07-21 DIAGNOSIS — K219 Gastro-esophageal reflux disease without esophagitis: Secondary | ICD-10-CM | POA: Diagnosis not present

## 2019-07-21 DIAGNOSIS — S39012A Strain of muscle, fascia and tendon of lower back, initial encounter: Secondary | ICD-10-CM | POA: Diagnosis not present

## 2019-07-21 DIAGNOSIS — M4805 Spinal stenosis, thoracolumbar region: Secondary | ICD-10-CM | POA: Diagnosis not present

## 2019-07-21 DIAGNOSIS — K589 Irritable bowel syndrome without diarrhea: Secondary | ICD-10-CM | POA: Diagnosis not present

## 2019-07-21 DIAGNOSIS — I509 Heart failure, unspecified: Secondary | ICD-10-CM | POA: Diagnosis not present

## 2019-07-21 DIAGNOSIS — I110 Hypertensive heart disease with heart failure: Secondary | ICD-10-CM | POA: Diagnosis not present

## 2019-09-18 DIAGNOSIS — N3942 Incontinence without sensory awareness: Secondary | ICD-10-CM | POA: Diagnosis not present

## 2019-09-18 DIAGNOSIS — E1142 Type 2 diabetes mellitus with diabetic polyneuropathy: Secondary | ICD-10-CM | POA: Diagnosis not present

## 2019-09-18 DIAGNOSIS — Z6827 Body mass index (BMI) 27.0-27.9, adult: Secondary | ICD-10-CM | POA: Diagnosis not present

## 2019-09-18 DIAGNOSIS — Z0000 Encounter for general adult medical examination without abnormal findings: Secondary | ICD-10-CM | POA: Diagnosis not present

## 2019-09-18 DIAGNOSIS — Z1389 Encounter for screening for other disorder: Secondary | ICD-10-CM | POA: Diagnosis not present

## 2019-09-18 DIAGNOSIS — M545 Low back pain: Secondary | ICD-10-CM | POA: Diagnosis not present

## 2019-09-18 DIAGNOSIS — I10 Essential (primary) hypertension: Secondary | ICD-10-CM | POA: Diagnosis not present

## 2019-09-18 DIAGNOSIS — E039 Hypothyroidism, unspecified: Secondary | ICD-10-CM | POA: Diagnosis not present

## 2019-09-18 DIAGNOSIS — E7849 Other hyperlipidemia: Secondary | ICD-10-CM | POA: Diagnosis not present

## 2019-10-08 DIAGNOSIS — R531 Weakness: Secondary | ICD-10-CM | POA: Diagnosis not present

## 2019-10-08 DIAGNOSIS — R404 Transient alteration of awareness: Secondary | ICD-10-CM | POA: Diagnosis not present

## 2019-10-08 DIAGNOSIS — R4182 Altered mental status, unspecified: Secondary | ICD-10-CM | POA: Diagnosis not present

## 2019-10-08 DIAGNOSIS — N2889 Other specified disorders of kidney and ureter: Secondary | ICD-10-CM | POA: Diagnosis not present

## 2019-10-08 DIAGNOSIS — G4489 Other headache syndrome: Secondary | ICD-10-CM | POA: Diagnosis not present

## 2019-10-08 DIAGNOSIS — E785 Hyperlipidemia, unspecified: Secondary | ICD-10-CM | POA: Diagnosis not present

## 2019-10-08 DIAGNOSIS — Z20828 Contact with and (suspected) exposure to other viral communicable diseases: Secondary | ICD-10-CM | POA: Diagnosis not present

## 2019-10-08 DIAGNOSIS — E876 Hypokalemia: Secondary | ICD-10-CM | POA: Diagnosis not present

## 2019-10-08 DIAGNOSIS — E860 Dehydration: Secondary | ICD-10-CM | POA: Diagnosis not present

## 2019-10-08 DIAGNOSIS — E871 Hypo-osmolality and hyponatremia: Secondary | ICD-10-CM | POA: Diagnosis not present

## 2019-10-08 DIAGNOSIS — K5732 Diverticulitis of large intestine without perforation or abscess without bleeding: Secondary | ICD-10-CM | POA: Diagnosis not present

## 2019-10-08 DIAGNOSIS — I10 Essential (primary) hypertension: Secondary | ICD-10-CM | POA: Diagnosis not present

## 2019-10-08 DIAGNOSIS — K529 Noninfective gastroenteritis and colitis, unspecified: Secondary | ICD-10-CM | POA: Diagnosis not present

## 2019-10-08 DIAGNOSIS — K760 Fatty (change of) liver, not elsewhere classified: Secondary | ICD-10-CM | POA: Diagnosis not present

## 2019-10-08 DIAGNOSIS — I4891 Unspecified atrial fibrillation: Secondary | ICD-10-CM | POA: Diagnosis not present

## 2019-10-08 DIAGNOSIS — E039 Hypothyroidism, unspecified: Secondary | ICD-10-CM | POA: Diagnosis not present

## 2019-10-08 DIAGNOSIS — E1165 Type 2 diabetes mellitus with hyperglycemia: Secondary | ICD-10-CM | POA: Diagnosis not present

## 2019-10-08 DIAGNOSIS — I491 Atrial premature depolarization: Secondary | ICD-10-CM | POA: Diagnosis not present

## 2019-10-08 DIAGNOSIS — R197 Diarrhea, unspecified: Secondary | ICD-10-CM | POA: Diagnosis not present

## 2019-10-16 DIAGNOSIS — D500 Iron deficiency anemia secondary to blood loss (chronic): Secondary | ICD-10-CM | POA: Diagnosis not present

## 2019-10-16 DIAGNOSIS — N2889 Other specified disorders of kidney and ureter: Secondary | ICD-10-CM | POA: Diagnosis not present

## 2019-10-16 DIAGNOSIS — Z720 Tobacco use: Secondary | ICD-10-CM | POA: Diagnosis not present

## 2019-10-16 DIAGNOSIS — C763 Malignant neoplasm of pelvis: Secondary | ICD-10-CM | POA: Diagnosis not present

## 2019-10-16 DIAGNOSIS — K5733 Diverticulitis of large intestine without perforation or abscess with bleeding: Secondary | ICD-10-CM | POA: Diagnosis not present

## 2019-10-16 DIAGNOSIS — R634 Abnormal weight loss: Secondary | ICD-10-CM | POA: Diagnosis not present

## 2019-10-23 DIAGNOSIS — K5732 Diverticulitis of large intestine without perforation or abscess without bleeding: Secondary | ICD-10-CM | POA: Diagnosis not present

## 2019-10-23 DIAGNOSIS — Z6826 Body mass index (BMI) 26.0-26.9, adult: Secondary | ICD-10-CM | POA: Diagnosis not present

## 2019-10-23 DIAGNOSIS — N3942 Incontinence without sensory awareness: Secondary | ICD-10-CM | POA: Diagnosis not present

## 2019-10-23 DIAGNOSIS — E1142 Type 2 diabetes mellitus with diabetic polyneuropathy: Secondary | ICD-10-CM | POA: Diagnosis not present

## 2019-10-23 DIAGNOSIS — M545 Low back pain: Secondary | ICD-10-CM | POA: Diagnosis not present

## 2019-10-23 DIAGNOSIS — I10 Essential (primary) hypertension: Secondary | ICD-10-CM | POA: Diagnosis not present

## 2019-10-23 DIAGNOSIS — E039 Hypothyroidism, unspecified: Secondary | ICD-10-CM | POA: Diagnosis not present

## 2019-10-23 DIAGNOSIS — E7849 Other hyperlipidemia: Secondary | ICD-10-CM | POA: Diagnosis not present

## 2019-10-23 DIAGNOSIS — Z0000 Encounter for general adult medical examination without abnormal findings: Secondary | ICD-10-CM | POA: Diagnosis not present

## 2019-10-25 ENCOUNTER — Other Ambulatory Visit (HOSPITAL_COMMUNITY): Payer: Self-pay | Admitting: Oncology

## 2019-10-25 ENCOUNTER — Other Ambulatory Visit: Payer: Self-pay | Admitting: Oncology

## 2019-10-25 DIAGNOSIS — N2889 Other specified disorders of kidney and ureter: Secondary | ICD-10-CM

## 2019-10-25 DIAGNOSIS — R634 Abnormal weight loss: Secondary | ICD-10-CM

## 2019-10-25 DIAGNOSIS — C763 Malignant neoplasm of pelvis: Secondary | ICD-10-CM

## 2019-10-31 DIAGNOSIS — C763 Malignant neoplasm of pelvis: Secondary | ICD-10-CM | POA: Diagnosis not present

## 2019-10-31 DIAGNOSIS — K5733 Diverticulitis of large intestine without perforation or abscess with bleeding: Secondary | ICD-10-CM | POA: Diagnosis not present

## 2019-10-31 DIAGNOSIS — N2889 Other specified disorders of kidney and ureter: Secondary | ICD-10-CM | POA: Diagnosis not present

## 2019-10-31 DIAGNOSIS — Z720 Tobacco use: Secondary | ICD-10-CM | POA: Diagnosis not present

## 2019-10-31 DIAGNOSIS — R634 Abnormal weight loss: Secondary | ICD-10-CM | POA: Diagnosis not present

## 2019-10-31 DIAGNOSIS — D500 Iron deficiency anemia secondary to blood loss (chronic): Secondary | ICD-10-CM | POA: Diagnosis not present

## 2019-11-02 ENCOUNTER — Other Ambulatory Visit: Payer: Self-pay

## 2019-11-02 ENCOUNTER — Encounter (HOSPITAL_COMMUNITY)
Admission: RE | Admit: 2019-11-02 | Discharge: 2019-11-02 | Disposition: A | Discharge status: Home / Self Care | Payer: Medicare HMO | Source: Ambulatory Visit | Attending: Oncology | Admitting: Oncology

## 2019-11-02 DIAGNOSIS — N2889 Other specified disorders of kidney and ureter: Secondary | ICD-10-CM | POA: Insufficient documentation

## 2019-11-02 DIAGNOSIS — R634 Abnormal weight loss: Secondary | ICD-10-CM | POA: Diagnosis not present

## 2019-11-02 DIAGNOSIS — C763 Malignant neoplasm of pelvis: Secondary | ICD-10-CM | POA: Diagnosis not present

## 2019-11-02 LAB — GLUCOSE, CAPILLARY: Glucose-Capillary: 172 mg/dL — ABNORMAL HIGH (ref 70–99)

## 2019-11-02 MED ORDER — FLUDEOXYGLUCOSE F - 18 (FDG) INJECTION
7.3000 | Freq: Once | INTRAVENOUS | Status: AC | PRN
  Administered 2019-11-02: 7.3 via INTRAVENOUS

## 2019-11-09 DIAGNOSIS — Z01818 Encounter for other preprocedural examination: Secondary | ICD-10-CM | POA: Diagnosis not present

## 2019-11-13 DIAGNOSIS — D500 Iron deficiency anemia secondary to blood loss (chronic): Secondary | ICD-10-CM | POA: Diagnosis not present

## 2019-11-13 DIAGNOSIS — K6289 Other specified diseases of anus and rectum: Secondary | ICD-10-CM | POA: Diagnosis not present

## 2019-11-13 DIAGNOSIS — K589 Irritable bowel syndrome without diarrhea: Secondary | ICD-10-CM | POA: Diagnosis not present

## 2019-11-13 DIAGNOSIS — D128 Benign neoplasm of rectum: Secondary | ICD-10-CM | POA: Diagnosis not present

## 2019-11-13 DIAGNOSIS — K2950 Unspecified chronic gastritis without bleeding: Secondary | ICD-10-CM | POA: Diagnosis not present

## 2019-11-13 DIAGNOSIS — K621 Rectal polyp: Secondary | ICD-10-CM | POA: Diagnosis not present

## 2019-11-13 DIAGNOSIS — J449 Chronic obstructive pulmonary disease, unspecified: Secondary | ICD-10-CM | POA: Diagnosis not present

## 2019-11-13 DIAGNOSIS — K219 Gastro-esophageal reflux disease without esophagitis: Secondary | ICD-10-CM | POA: Diagnosis not present

## 2019-11-13 DIAGNOSIS — K5730 Diverticulosis of large intestine without perforation or abscess without bleeding: Secondary | ICD-10-CM | POA: Diagnosis not present

## 2019-11-13 DIAGNOSIS — K259 Gastric ulcer, unspecified as acute or chronic, without hemorrhage or perforation: Secondary | ICD-10-CM | POA: Diagnosis not present

## 2019-11-13 DIAGNOSIS — D509 Iron deficiency anemia, unspecified: Secondary | ICD-10-CM | POA: Diagnosis not present

## 2019-11-13 DIAGNOSIS — K641 Second degree hemorrhoids: Secondary | ICD-10-CM | POA: Diagnosis not present

## 2019-11-19 DIAGNOSIS — R269 Unspecified abnormalities of gait and mobility: Secondary | ICD-10-CM | POA: Diagnosis not present

## 2019-11-19 DIAGNOSIS — N2889 Other specified disorders of kidney and ureter: Secondary | ICD-10-CM | POA: Diagnosis not present

## 2019-11-19 DIAGNOSIS — Z720 Tobacco use: Secondary | ICD-10-CM | POA: Diagnosis not present

## 2019-11-19 DIAGNOSIS — R634 Abnormal weight loss: Secondary | ICD-10-CM | POA: Diagnosis not present

## 2019-11-29 DIAGNOSIS — Z6827 Body mass index (BMI) 27.0-27.9, adult: Secondary | ICD-10-CM | POA: Diagnosis not present

## 2019-11-29 DIAGNOSIS — M545 Low back pain: Secondary | ICD-10-CM | POA: Diagnosis not present

## 2019-12-05 DIAGNOSIS — E119 Type 2 diabetes mellitus without complications: Secondary | ICD-10-CM | POA: Diagnosis not present

## 2019-12-05 DIAGNOSIS — Z794 Long term (current) use of insulin: Secondary | ICD-10-CM | POA: Diagnosis not present

## 2019-12-05 DIAGNOSIS — D126 Benign neoplasm of colon, unspecified: Secondary | ICD-10-CM | POA: Diagnosis not present

## 2019-12-05 DIAGNOSIS — I509 Heart failure, unspecified: Secondary | ICD-10-CM | POA: Diagnosis not present

## 2019-12-05 DIAGNOSIS — Z853 Personal history of malignant neoplasm of breast: Secondary | ICD-10-CM | POA: Diagnosis not present

## 2019-12-05 DIAGNOSIS — D49512 Neoplasm of unspecified behavior of left kidney: Secondary | ICD-10-CM | POA: Diagnosis not present

## 2019-12-05 DIAGNOSIS — H3530 Unspecified macular degeneration: Secondary | ICD-10-CM | POA: Diagnosis not present

## 2019-12-05 DIAGNOSIS — J449 Chronic obstructive pulmonary disease, unspecified: Secondary | ICD-10-CM | POA: Diagnosis not present

## 2019-12-05 DIAGNOSIS — D49511 Neoplasm of unspecified behavior of right kidney: Secondary | ICD-10-CM | POA: Diagnosis not present

## 2019-12-05 DIAGNOSIS — I110 Hypertensive heart disease with heart failure: Secondary | ICD-10-CM | POA: Diagnosis not present

## 2019-12-05 DIAGNOSIS — N2889 Other specified disorders of kidney and ureter: Secondary | ICD-10-CM | POA: Diagnosis not present

## 2019-12-13 DIAGNOSIS — R269 Unspecified abnormalities of gait and mobility: Secondary | ICD-10-CM | POA: Diagnosis not present

## 2019-12-13 DIAGNOSIS — N2889 Other specified disorders of kidney and ureter: Secondary | ICD-10-CM | POA: Diagnosis not present

## 2019-12-13 DIAGNOSIS — Z720 Tobacco use: Secondary | ICD-10-CM | POA: Diagnosis not present

## 2019-12-13 DIAGNOSIS — R112 Nausea with vomiting, unspecified: Secondary | ICD-10-CM | POA: Diagnosis not present

## 2019-12-27 DIAGNOSIS — N2889 Other specified disorders of kidney and ureter: Secondary | ICD-10-CM | POA: Diagnosis not present

## 2020-01-08 DIAGNOSIS — D500 Iron deficiency anemia secondary to blood loss (chronic): Secondary | ICD-10-CM | POA: Diagnosis not present

## 2020-01-08 DIAGNOSIS — Z1589 Genetic susceptibility to other disease: Secondary | ICD-10-CM | POA: Diagnosis not present

## 2020-01-08 DIAGNOSIS — Z1509 Genetic susceptibility to other malignant neoplasm: Secondary | ICD-10-CM | POA: Diagnosis not present

## 2020-01-08 DIAGNOSIS — Z1501 Genetic susceptibility to malignant neoplasm of breast: Secondary | ICD-10-CM | POA: Diagnosis not present

## 2020-01-08 DIAGNOSIS — N2889 Other specified disorders of kidney and ureter: Secondary | ICD-10-CM | POA: Diagnosis not present

## 2020-01-09 DIAGNOSIS — I10 Essential (primary) hypertension: Secondary | ICD-10-CM | POA: Diagnosis not present

## 2020-01-09 DIAGNOSIS — M545 Low back pain: Secondary | ICD-10-CM | POA: Diagnosis not present

## 2020-01-09 DIAGNOSIS — E1142 Type 2 diabetes mellitus with diabetic polyneuropathy: Secondary | ICD-10-CM | POA: Diagnosis not present

## 2020-01-09 DIAGNOSIS — Z6827 Body mass index (BMI) 27.0-27.9, adult: Secondary | ICD-10-CM | POA: Diagnosis not present

## 2020-01-09 DIAGNOSIS — C4492 Squamous cell carcinoma of skin, unspecified: Secondary | ICD-10-CM | POA: Diagnosis not present

## 2020-01-09 DIAGNOSIS — C44622 Squamous cell carcinoma of skin of right upper limb, including shoulder: Secondary | ICD-10-CM | POA: Diagnosis not present

## 2020-04-09 DIAGNOSIS — E1142 Type 2 diabetes mellitus with diabetic polyneuropathy: Secondary | ICD-10-CM | POA: Diagnosis not present

## 2020-04-09 DIAGNOSIS — M545 Low back pain: Secondary | ICD-10-CM | POA: Diagnosis not present

## 2020-04-09 DIAGNOSIS — Z6827 Body mass index (BMI) 27.0-27.9, adult: Secondary | ICD-10-CM | POA: Diagnosis not present

## 2020-04-09 DIAGNOSIS — I10 Essential (primary) hypertension: Secondary | ICD-10-CM | POA: Diagnosis not present

## 2020-04-09 DIAGNOSIS — C4492 Squamous cell carcinoma of skin, unspecified: Secondary | ICD-10-CM | POA: Diagnosis not present

## 2020-05-23 DIAGNOSIS — I509 Heart failure, unspecified: Secondary | ICD-10-CM | POA: Diagnosis not present

## 2020-05-23 DIAGNOSIS — R1084 Generalized abdominal pain: Secondary | ICD-10-CM | POA: Diagnosis not present

## 2020-05-23 DIAGNOSIS — J811 Chronic pulmonary edema: Secondary | ICD-10-CM | POA: Diagnosis not present

## 2020-05-23 DIAGNOSIS — J9811 Atelectasis: Secondary | ICD-10-CM | POA: Diagnosis not present

## 2020-05-23 DIAGNOSIS — Z20822 Contact with and (suspected) exposure to covid-19: Secondary | ICD-10-CM | POA: Diagnosis not present

## 2020-05-23 DIAGNOSIS — R110 Nausea: Secondary | ICD-10-CM | POA: Diagnosis not present

## 2020-05-23 DIAGNOSIS — I110 Hypertensive heart disease with heart failure: Secondary | ICD-10-CM | POA: Diagnosis not present

## 2020-05-23 DIAGNOSIS — C649 Malignant neoplasm of unspecified kidney, except renal pelvis: Secondary | ICD-10-CM | POA: Diagnosis not present

## 2020-05-23 DIAGNOSIS — R0902 Hypoxemia: Secondary | ICD-10-CM | POA: Diagnosis not present

## 2020-05-23 DIAGNOSIS — K5641 Fecal impaction: Secondary | ICD-10-CM | POA: Diagnosis not present

## 2020-05-23 DIAGNOSIS — E1165 Type 2 diabetes mellitus with hyperglycemia: Secondary | ICD-10-CM | POA: Diagnosis not present

## 2020-05-23 DIAGNOSIS — Z66 Do not resuscitate: Secondary | ICD-10-CM | POA: Diagnosis not present

## 2020-05-23 DIAGNOSIS — K6389 Other specified diseases of intestine: Secondary | ICD-10-CM | POA: Diagnosis not present

## 2020-05-23 DIAGNOSIS — R197 Diarrhea, unspecified: Secondary | ICD-10-CM | POA: Diagnosis not present

## 2020-05-23 DIAGNOSIS — A419 Sepsis, unspecified organism: Secondary | ICD-10-CM | POA: Diagnosis not present

## 2020-05-23 DIAGNOSIS — R918 Other nonspecific abnormal finding of lung field: Secondary | ICD-10-CM | POA: Diagnosis not present

## 2020-05-23 DIAGNOSIS — I4891 Unspecified atrial fibrillation: Secondary | ICD-10-CM | POA: Diagnosis not present

## 2020-05-23 DIAGNOSIS — N390 Urinary tract infection, site not specified: Secondary | ICD-10-CM | POA: Diagnosis not present

## 2020-05-23 DIAGNOSIS — E1142 Type 2 diabetes mellitus with diabetic polyneuropathy: Secondary | ICD-10-CM | POA: Diagnosis not present

## 2020-05-23 DIAGNOSIS — K228 Other specified diseases of esophagus: Secondary | ICD-10-CM | POA: Diagnosis not present

## 2020-05-23 DIAGNOSIS — R109 Unspecified abdominal pain: Secondary | ICD-10-CM | POA: Diagnosis not present

## 2020-05-23 DIAGNOSIS — I480 Paroxysmal atrial fibrillation: Secondary | ICD-10-CM | POA: Diagnosis not present

## 2020-05-23 DIAGNOSIS — Z794 Long term (current) use of insulin: Secondary | ICD-10-CM | POA: Diagnosis not present

## 2020-05-23 DIAGNOSIS — R52 Pain, unspecified: Secondary | ICD-10-CM | POA: Diagnosis not present

## 2020-05-23 DIAGNOSIS — I700 Atherosclerosis of aorta: Secondary | ICD-10-CM | POA: Diagnosis not present

## 2020-06-22 DEATH — deceased
